# Patient Record
Sex: Female | Born: 1990 | Race: White | Hispanic: No | Marital: Married | Smoking: Never smoker
Health system: Southern US, Community
[De-identification: ages and names within clinical notes are randomized; demographics above are authoritative.]

## PROBLEM LIST (undated history)

## (undated) DIAGNOSIS — G43909 Migraine, unspecified, not intractable, without status migrainosus: Secondary | ICD-10-CM

## (undated) DIAGNOSIS — K5792 Diverticulitis of intestine, part unspecified, without perforation or abscess without bleeding: Secondary | ICD-10-CM

## (undated) DIAGNOSIS — A64 Unspecified sexually transmitted disease: Secondary | ICD-10-CM

## (undated) DIAGNOSIS — M419 Scoliosis, unspecified: Secondary | ICD-10-CM

## (undated) HISTORY — DX: Migraine, unspecified, not intractable, without status migrainosus: G43.909

## (undated) HISTORY — DX: Diverticulitis of intestine, part unspecified, without perforation or abscess without bleeding: K57.92

## (undated) HISTORY — DX: Unspecified sexually transmitted disease: A64

---
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
## 2012-05-21 ENCOUNTER — Encounter: Payer: Self-pay | Admitting: Obstetrics & Gynecology

## 2013-04-09 ENCOUNTER — Other Ambulatory Visit: Payer: Self-pay | Admitting: Obstetrics & Gynecology

## 2013-05-19 ENCOUNTER — Ambulatory Visit: Payer: Self-pay | Admitting: Obstetrics & Gynecology

## 2013-05-19 ENCOUNTER — Other Ambulatory Visit: Payer: Self-pay | Admitting: *Deleted

## 2013-05-19 DIAGNOSIS — IMO0001 Reserved for inherently not codable concepts without codable children: Secondary | ICD-10-CM

## 2013-05-19 MED ORDER — NORGESTIMATE-ETH ESTRADIOL 0.25-35 MG-MCG PO TABS: 1.0000 | ORAL_TABLET | Freq: Every day | ORAL | Status: DC

## 2013-06-27 ENCOUNTER — Ambulatory Visit: Payer: Self-pay | Admitting: Obstetrics & Gynecology

## 2013-08-01 ENCOUNTER — Ambulatory Visit (INDEPENDENT_AMBULATORY_CARE_PROVIDER_SITE_OTHER): Payer: BC Managed Care – PPO | Admitting: Obstetrics & Gynecology

## 2013-08-01 ENCOUNTER — Encounter: Payer: Self-pay | Admitting: Obstetrics & Gynecology

## 2013-08-01 VITALS — BP 108/71 | HR 69 | Wt 136.0 lb

## 2013-08-01 DIAGNOSIS — Z01419 Encounter for gynecological examination (general) (routine) without abnormal findings: Secondary | ICD-10-CM

## 2013-08-01 NOTE — Progress Notes (Signed)
Subjective:     Natasha Hensley is a 23 y.o. female here for a routine exam.  Current complaints: none.    Personal health questionnaire:  Is patient Natasha Hensley, have a family history of breast and/or ovarian cancer: no Is there a family history of uterine cancer diagnosed at age < 9350, gastrointestinal cancer, urinary tract cancer, family member who is a Personnel officerLynch syndrome-associated carrier: no Is the patient overweight and hypertensive, family history of diabetes, personal history of gestational diabetes or PCOS: no Is patient over 5055, have PCOS,  family history of premature CHD under age 25, diabetes, smoke, have hypertension or peripheral artery disease:  no  Gynecologic History Patient's last menstrual period was 07/18/2013. Contraception: OCP (estrogen/progesterone) Last Pap results were: normal  Obstetric History OB History  No data available    History reviewed. No pertinent past medical history.  History reviewed. No pertinent past surgical history.  Current outpatient prescriptions:norgestimate-ethinyl estradiol (SPRINTEC 28) 0.25-35 MG-MCG tablet, Take 1 tablet by mouth daily., Disp: 28 tablet, Rfl: 3 Not on File  History  Substance Use Topics  . Smoking status: Never Smoker   . Smokeless tobacco: Not on file  . Alcohol Use: No    History reviewed. No pertinent family history.    Review of Systems  Constitutional: negative for fatigue and weight loss Respiratory: negative for cough and wheezing Cardiovascular: negative for chest pain, fatigue and palpitations Gastrointestinal: negative for abdominal pain and change in bowel habits Musculoskeletal:negative for myalgias Neurological: negative for gait problems and tremors Behavioral/Psych: negative for abusive relationship, depression Endocrine: negative for temperature intolerance   Genitourinary:negative for abnormal menstrual periods, genital lesions, hot flashes, sexual problems and vaginal  discharge Integument/breast: negative for breast lump, breast tenderness, nipple discharge and skin lesion(s)    Objective:       BP 108/71  Pulse 69  Wt 61.689 kg (136 lb)  LMP 07/18/2013 General:   alert  Skin:   no rash or abnormalities  Lungs:   clear to auscultation bilaterally  Heart:   regular rate and rhythm, S1, S2 normal, no murmur, click, rub or gallop  Breasts:   normal without suspicious masses, skin or nipple changes or axillary nodes  Abdomen:  normal findings: no organomegaly, soft, non-tender and no hernia  Pelvis:  External genitalia: normal general appearance Urinary system: urethral meatus normal and bladder without fullness, nontender Vaginal: normal without tenderness, induration or masses Cervix: normal appearance Adnexa: normal bimanual exam Uterus: anteverted and non-tender, normal size   Lab Review  Labs reviewed no Radiologic studies reviewed no    Assessment:    Healthy female exam.    Plan:    Education reviewed: depression evaluation, low fat, low cholesterol diet, safe sex/STD prevention, weight bearing exercise and HPV catch-up vaccine. Contraception: OCP (estrogen/progesterone). Orders Placed This Encounter  Procedures  . Ambulatory referral to Dermatology    Referral Priority:  Elective    Referral Type:  Consultation    Referral Reason:  Specialty Services Required    Requested Specialty:  Dermatology    Number of Visits Requested:  1   Follow up as needed, or nine valent HPV vaccine

## 2013-08-02 LAB — PAP IG AND CT-NG NAA
Chlamydia Probe Amp: NEGATIVE
GC PROBE AMP: NEGATIVE

## 2013-08-02 NOTE — Patient Instructions (Signed)
Human Papillomavirus (HPV) Gardasil Vaccine What You Need to Know WHAT IS HPV?  Genital human papillomavirus (HPV) is the most common sexually transmitted virus in the [place]. More than half of sexually active men and women are infected with HPV at some time in their lives.  About 20 million Americans are currently infected, and about 6 million more get infected each year. HPV is usually spread through sexual contact.  Most HPV infections do not cause any symptoms and go away on their own. But HPV can cause cervical cancer in women. Cervical cancer is the 2nd leading cause of cancer deaths among women around the world. In the [place], about 12,000 women get cervical cancer every year and about 4,000 are expected to die from it.  HPV is also associated with several less common cancers, such as vaginal and vulvar cancers in women, and anal and oropharyngeal (back of the throat, including base of tongue and tonsils) cancers in both men and women. HPV can also cause genital warts and warts in the throat.  There is no cure for HPV infection, but some of the problems it causes can be treated. HPV VACCINE: WHY GET VACCINATED?  The HPV vaccine you are getting is 1 of 2 vaccines that can be given to prevent HPV. It may be given to both males and females.  This vaccine can prevent most cases of cervical cancer in females, if it is given before exposure to the virus. In addition, it can prevent vaginal and vulvar cancer in females, and genital warts and anal cancer in both males and females.  Protection from HPV vaccine is expected to be long-lasting. But vaccination is not a substitute for cervical cancer screening. Women should still get regular Pap tests. WHO SHOULD GET THIS HPV VACCINE AND WHEN? HPV vaccine is given as a 3-dose series.  1st Dose: Now.  2nd Dose: 1 to 2 months after Dose 1.  3rd Dose: 6 months after Dose 1. Additional (booster) doses are not recommended. Routine  Vaccination This HPV vaccine is recommended for girls and boys 7 or 25 years of age. It may be given starting at age 72. Why is HPV vaccine recommended at 59 or 25 years of age?  HPV infection is easily acquired, even with only one sex partner. That is why it is important to get HPV vaccine before any sexual contact takes place. Also, response to the vaccine is better at this age than at older ages. Catch-Up Vaccination This vaccine is recommended for the following people who have not completed the 3-dose series:   Females 60 through 25 years of age.  Males 29 through 25 years of age. This vaccine may be given to men 63 through 25 years of age who have not completed the 3-dose series. It is recommended for men through age 81 who have sex with men or whose immune system is weakened because of HIV infection, other illness, or medications.  HPV vaccine may be given at the same time as other vaccines. SOME PEOPLE SHOULD NOT GET HPV VACCINE OR SHOULD WAIT  Anyone who has ever had a life-threatening allergic reaction to any other component of HPV vaccine, or to a previous dose of HPV vaccine, should not get the vaccine. Tell your doctor if the person getting vaccinated has any severe allergies, including an allergy to yeast.  HPV vaccine is not recommended for pregnant women. However, receiving HPV vaccine when pregnant is not a reason to consider terminating the pregnancy.  Women who are breastfeeding may get the vaccine.  People who are mildly ill when a dose of HPV is planned can still be vaccinated. People with a moderate or severe illness should wait until they are better. WHAT ARE THE RISKS FROM THIS VACCINE?  This HPV vaccine has been used in the [place] and around the world for about 6 years and has been very safe.  However, any medicine could possibly cause a serious problem, such as a severe allergic reaction. The risk of any vaccine causing a serious injury, or death, is extremely  small.  Life-threatening allergic reactions from vaccines are very rare. If they do occur, it would be within a few minutes to a few hours after the vaccination. Several mild to moderate problems are known to occur with HPV vaccine. These do not last long and go away on their own.  Reactions in the arm where the shot was given:  Pain (about 8 people in 10).  Redness or swelling (about 1 person in 4).  Fever:  Mild (100 F or 37.8 C) (about 1 person in 10).  Moderate (102 F or 38.9 C) (about 1 person in 70).  Other problems:  Headache (about 1 person in 3).  Fainting: Brief fainting spells and related symptoms (such as jerking movements) can happen after any medical procedure, including vaccination. Sitting or lying down for about 15 minutes after a vaccination can help prevent fainting and injuries caused by falls. Tell your doctor if the patient feels dizzy or lightheaded, or has vision changes or ringing in the ears.  Like all vaccines, HPV vaccines will continue to be monitored for unusual or severe problems. WHAT IF THERE IS A SERIOUS REACTION? What should I look for?  Any unusual condition, such as a high fever or unusual behavior. Signs of a serious allergic reaction can include difficulty breathing, hoarseness or wheezing, hives, paleness, weakness, a fast heartbeat, or dizziness. What should I do?  Call a doctor, or get the person to a doctor right away.  Tell your doctor what happened, the date and time it happened, and when the vaccination was given.  Ask your doctor, nurse, or health department to report the reaction by filing a Vaccine Adverse Event Reporting System (VAERS) form. Or, you can file this report through the VAERS website at www.vaers.SamedayNews.es or by calling 587-354-9670. VAERS does not provide medical advice. THE NATIONAL VACCINE INJURY COMPENSATION PROGRAM  The National Vaccine Injury Compensation Program (VICP) is a federal program that was created  to compensate people who may have been injured by certain vaccines.  Persons who believe they may have been injured by a vaccine can learn about the program and about filing a claim by calling 8475407146 or visiting the [place] website at GoldCloset.com.ee [place]?  Ask your doctor.  Call your local or state health department.  Contact the Centers for Disease Control and Prevention (CDC):  Call 410-236-0509 (1-800-CDC-INFO)  or  Visit CDC's website at http://hunter.com/ CDC Human Papillomavirus (HPV) Gardasil (Interim) 06/06/11 Document Released: 11/03/2005 Document Revised: 10/27/2012 Document Reviewed: 04/27/2012 Exit[employer] Patient Information 2015 [place], [place]. This information is not intended to replace advice given to you by your health care provider. Make sure you discuss any questions you have with your health care provider. Health Maintenance, Female A healthy lifestyle and preventative care can promote health and wellness.  Maintain regular health, dental, and eye exams.  Eat a healthy diet. Foods like vegetables, fruits, whole grains, low-fat dairy  products, and lean protein foods contain the nutrients you need without too many calories. Decrease your intake of foods high in solid fats, added sugars, and salt. Get information about a proper diet from your caregiver, if necessary.  Regular physical exercise is one of the most important things you can do for your health. Most adults should get at least 150 minutes of moderate-intensity exercise (any activity that increases your heart rate and causes you to sweat) each week. In addition, most adults need muscle-strengthening exercises on 2 or more days a week.   Maintain a healthy weight. The body mass index (BMI) is a screening tool to identify possible weight problems. It provides an estimate of body fat based on height and weight. Your caregiver can help determine your BMI, and can help you  achieve or maintain a healthy weight. For adults 20 years and older:  A BMI below 18.5 is considered underweight.  A BMI of 18.5 to 24.9 is normal.  A BMI of 25 to 29.9 is considered overweight.  A BMI of 30 and above is considered obese.  Maintain normal blood lipids and cholesterol by exercising and minimizing your intake of saturated fat. Eat a balanced diet with plenty of fruits and vegetables. Blood tests for lipids and cholesterol should begin at age 25 and be repeated every 5 years. If your lipid or cholesterol levels are high, you are over 50, or you are a high risk for heart disease, you may need your cholesterol levels checked more frequently.Ongoing high lipid and cholesterol levels should be treated with medicines if diet and exercise are not effective.  If you smoke, find out from your caregiver how to quit. If you do not use tobacco, do not start.  Lung cancer screening is recommended for adults aged 79-80 years who are at high risk for developing lung cancer because of a history of smoking. Yearly low-dose computed tomography (CT) is recommended for people who have at least a 30-pack-year history of smoking and are a current smoker or have quit within the past 15 years. A pack year of smoking is smoking an average of 1 pack of cigarettes a day for 1 year (for example: 1 pack a day for 30 years or 2 packs a day for 15 years). Yearly screening should continue until the smoker has stopped smoking for at least 15 years. Yearly screening should also be stopped for people who develop a health problem that would prevent them from having lung cancer treatment.  If you are pregnant, do not drink alcohol. If you are breastfeeding, be very cautious about drinking alcohol. If you are not pregnant and choose to drink alcohol, do not exceed 1 drink per day. One drink is considered to be 12 ounces (355 mL) of beer, 5 ounces (148 mL) of wine, or 1.5 ounces (44 mL) of liquor.  Avoid use of street  drugs. Do not share needles with anyone. Ask for help if you need support or instructions about stopping the use of drugs.  High blood pressure causes heart disease and increases the risk of stroke. Blood pressure should be checked at least every 1 to 2 years. Ongoing high blood pressure should be treated with medicines, if weight loss and exercise are not effective.  If you are 88 to 25 years old, ask your caregiver if you should take aspirin to prevent strokes.  Diabetes screening involves taking a blood sample to check your fasting blood sugar level. This should be done once every  3 years, after age 4, if you are within normal weight and without risk factors for diabetes. Testing should be considered at a younger age or be carried out more frequently if you are overweight and have at least 1 risk factor for diabetes.  Breast cancer screening is essential preventative care for women. You should practice "breast self-awareness." This means understanding the normal appearance and feel of your breasts and may include breast self-examination. Any changes detected, no matter how small, should be reported to a caregiver. Women in their 75s and 30s should have a clinical breast exam (CBE) by a caregiver as part of a regular health exam every 1 to 3 years. After age 34, women should have a CBE every year. Starting at age 77, women should consider having a mammogram (breast X-ray) every year. Women who have a family history of breast cancer should talk to their caregiver about genetic screening. Women at a high risk of breast cancer should talk to their caregiver about having an MRI and a mammogram every year.  Breast cancer gene (BRCA)-related cancer risk assessment is recommended for women who have family members with BRCA-related cancers. BRCA-related cancers include breast, ovarian, tubal, and peritoneal cancers. Having family members with these cancers may be associated with an increased risk for harmful  changes (mutations) in the breast cancer genes BRCA1 and BRCA2. Results of the assessment will determine the need for genetic counseling and BRCA1 and BRCA2 testing.  The Pap test is a screening test for cervical cancer. Women should have a Pap test starting at age 50. Between ages 16 and 61, Pap tests should be repeated every 2 years. Beginning at age 6, you should have a Pap test every 3 years as long as the past 3 Pap tests have been normal. If you had a hysterectomy for a problem that was not cancer or a condition that could lead to cancer, then you no longer need Pap tests. If you are between ages 77 and 63, and you have had normal Pap tests going back 10 years, you no longer need Pap tests. If you have had past treatment for cervical cancer or a condition that could lead to cancer, you need Pap tests and screening for cancer for at least 20 years after your treatment. If Pap tests have been discontinued, risk factors (such as a new sexual partner) need to be reassessed to determine if screening should be resumed. Some women have medical problems that increase the chance of getting cervical cancer. In these cases, your caregiver may recommend more frequent screening and Pap tests.  The human papillomavirus (HPV) test is an additional test that may be used for cervical cancer screening. The HPV test looks for the virus that can cause the cell changes on the cervix. The cells collected during the Pap test can be tested for HPV. The HPV test could be used to screen women aged 57 years and older, and should be used in women of any age who have unclear Pap test results. After the age of 8, women should have HPV testing at the same frequency as a Pap test.  Colorectal cancer can be detected and often prevented. Most routine colorectal cancer screening begins at the age of 7 and continues through age 36. However, your caregiver may recommend screening at an earlier age if you have risk factors for colon  cancer. On a yearly basis, your caregiver may provide home test kits to check for hidden blood in the stool. Use  of a small camera at the end of a tube, to directly examine the colon (sigmoidoscopy or colonoscopy), can detect the earliest forms of colorectal cancer. Talk to your caregiver about this at age 36, when routine screening begins. Direct examination of the colon should be repeated every 5 to 10 years through age 24, unless early forms of pre-cancerous polyps or small growths are found.  Hepatitis C blood testing is recommended for all people born from [birth year] through 1967 and any individual with known risks for hepatitis C.  Practice safe sex. Use condoms and avoid high-risk sexual practices to reduce the spread of sexually transmitted infections (STIs). Sexually active women aged 47 and younger should be checked for Chlamydia, which is a common sexually transmitted infection. Older women with new or multiple partners should also be tested for Chlamydia. Testing for other STIs is recommended if you are sexually active and at increased risk.  Osteoporosis is a disease in which the bones lose minerals and strength with aging. This can result in serious bone fractures. The risk of osteoporosis can be identified using a bone density scan. Women ages 15 and over and women at risk for fractures or osteoporosis should discuss screening with their caregivers. Ask your caregiver whether you should be taking a calcium supplement or vitamin D to reduce the rate of osteoporosis.  Menopause can be associated with physical symptoms and risks. Hormone replacement therapy is available to decrease symptoms and risks. You should talk to your caregiver about whether hormone replacement therapy is right for you.  Use sunscreen. Apply sunscreen liberally and repeatedly throughout the day. You should seek shade when your shadow is shorter than you. Protect yourself by wearing long sleeves, pants, a wide-brimmed hat, and  sunglasses year round, whenever you are outdoors.  Notify your caregiver of new moles or changes in moles, especially if there is a change in shape or color. Also notify your caregiver if a mole is larger than the size of a pencil eraser.  Stay current with your immunizations. Document Released: 07/22/2010 Document Revised: 05/03/2012 Document Reviewed: 12/08/2012 [facility] Patient Information 2015 [place], [place]. This information is not intended to replace advice given to you by your health care provider. Make sure you discuss any questions you have with your health care provider.

## 2013-09-03 ENCOUNTER — Other Ambulatory Visit: Payer: Self-pay | Admitting: Obstetrics & Gynecology

## 2014-01-16 ENCOUNTER — Encounter: Payer: Self-pay | Admitting: *Deleted

## 2014-01-17 ENCOUNTER — Encounter: Payer: Self-pay | Admitting: Obstetrics & Gynecology

## 2014-07-18 ENCOUNTER — Telehealth: Payer: Self-pay | Admitting: Obstetrics

## 2014-07-19 ENCOUNTER — Other Ambulatory Visit: Payer: Self-pay | Admitting: *Deleted

## 2014-07-19 DIAGNOSIS — Z3041 Encounter for surveillance of contraceptive pills: Secondary | ICD-10-CM

## 2014-07-19 MED ORDER — NORGESTIMATE-ETH ESTRADIOL 0.25-35 MG-MCG PO TABS
1.0000 | ORAL_TABLET | Freq: Every day | ORAL | Status: DC
Start: 2014-07-19 — End: 2014-11-02

## 2014-07-19 NOTE — Telephone Encounter (Signed)
[id number] - Patient called - AEX appt scheduled for [id number] @1 :45p with Orvilla [place]le Denney. [place] new pharmacy info .Marland Kitchen.[facility] [address]

## 2014-08-08 ENCOUNTER — Encounter: Payer: Self-pay | Admitting: Certified Nurse Midwife

## 2014-08-08 ENCOUNTER — Ambulatory Visit (INDEPENDENT_AMBULATORY_CARE_PROVIDER_SITE_OTHER): Payer: 59 | Admitting: Certified Nurse Midwife

## 2014-08-08 VITALS — BP 115/73 | HR 74 | Temp 97.8°F | Ht 63.0 in | Wt 138.0 lb

## 2014-08-08 DIAGNOSIS — Z3009 Encounter for other general counseling and advice on contraception: Secondary | ICD-10-CM

## 2014-08-08 DIAGNOSIS — Z124 Encounter for screening for malignant neoplasm of cervix: Secondary | ICD-10-CM

## 2014-08-08 DIAGNOSIS — N939 Abnormal uterine and vaginal bleeding, unspecified: Secondary | ICD-10-CM | POA: Diagnosis not present

## 2014-08-08 DIAGNOSIS — Z01419 Encounter for gynecological examination (general) (routine) without abnormal findings: Secondary | ICD-10-CM

## 2014-08-08 LAB — CBC WITH DIFFERENTIAL/PLATELET
BASOS PCT: 1 % (ref 0–1)
Basophils Absolute: 0.1 10*3/uL (ref 0.0–0.1)
EOS ABS: 0.2 10*3/uL (ref 0.0–0.7)
Eosinophils Relative: 3 % (ref 0–5)
HEMATOCRIT: 41.6 % (ref 36.0–46.0)
Hemoglobin: 14.2 g/dL (ref 12.0–15.0)
Lymphocytes Relative: 26 % (ref 12–46)
Lymphs Abs: 2.1 10*3/uL (ref 0.7–4.0)
MCH: 30.6 pg (ref 26.0–34.0)
MCHC: 34.1 g/dL (ref 30.0–36.0)
MCV: 89.7 fL (ref 78.0–100.0)
MPV: 10 fL (ref 8.6–12.4)
Monocytes Absolute: 0.3 10*3/uL (ref 0.1–1.0)
Monocytes Relative: 4 % (ref 3–12)
NEUTROS PCT: 66 % (ref 43–77)
Neutro Abs: 5.3 10*3/uL (ref 1.7–7.7)
PLATELETS: 296 10*3/uL (ref 150–400)
RBC: 4.64 MIL/uL (ref 3.87–5.11)
RDW: 13.9 % (ref 11.5–15.5)
WBC: 8 10*3/uL (ref 4.0–10.5)

## 2014-08-08 LAB — COMPREHENSIVE METABOLIC PANEL
ALT: 17 U/L (ref 0–35)
AST: 21 U/L (ref 0–37)
Albumin: 3.7 g/dL (ref 3.5–5.2)
Alkaline Phosphatase: 46 U/L (ref 39–117)
BUN: 12 mg/dL (ref 6–23)
CHLORIDE: 103 meq/L (ref 96–112)
CO2: 27 mEq/L (ref 19–32)
CREATININE: 0.73 mg/dL (ref 0.50–1.10)
Calcium: 9.5 mg/dL (ref 8.4–10.5)
GLUCOSE: 91 mg/dL (ref 70–99)
Potassium: 4.4 mEq/L (ref 3.5–5.3)
SODIUM: 140 meq/L (ref 135–145)
TOTAL PROTEIN: 7.3 g/dL (ref 6.0–8.3)
Total Bilirubin: 0.5 mg/dL (ref 0.2–1.2)

## 2014-08-08 LAB — HDL CHOLESTEROL: HDL: 45 mg/dL — ABNORMAL LOW (ref >=46)

## 2014-08-08 LAB — HIV ANTIBODY (ROUTINE TESTING W REFLEX): HIV 1&2 Ab, 4th Generation: NONREACTIVE

## 2014-08-08 LAB — TRIGLYCERIDES: Triglycerides: 103 mg/dL (ref <150)

## 2014-08-08 LAB — TSH: TSH: 0.652 u[IU]/mL (ref 0.350–4.500)

## 2014-08-08 LAB — CHOLESTEROL, TOTAL: CHOLESTEROL: 119 mg/dL (ref 0–200)

## 2014-08-08 MED ORDER — NORETHIN-ETH ESTRAD-FE BIPHAS 1 MG-10 MCG / 10 MCG PO TABS: 1.0000 | ORAL_TABLET | Freq: Every day | ORAL | Status: DC

## 2014-08-08 NOTE — Progress Notes (Signed)
Patient ID: Natasha Hensley, female   DOB: [birth year], 24 y.o.   MRN: [id number]    Subjective:      Natasha Hensley is a 24 y.o. female here for a routine exam.  Current complaints: none.  Does desire to discuss birth control.  Has been on Trisprintec, but is having 5-7 day periods with heavy bleeding.  Works full time as a Customer service managerreal estate agent.    Does go out in the sun, educated to use 50spf sunscreen.  Has had a dermatology skin exam within the last 6 months.    Personal health questionnaire:  Is patient [name], have a family history of breast and/or ovarian cancer: no Is there a family history of uterine cancer diagnosed at age < 4950, gastrointestinal cancer, urinary tract cancer, family member who is a Personnel officerLynch syndrome-associated carrier: no Is the patient overweight and hypertensive, family history of diabetes, personal history of gestational diabetes, preeclampsia or PCOS: no Is patient over 1355, have PCOS,  family history of premature CHD under age 26, diabetes, smoke, have hypertension or peripheral artery disease:  no At any time, has a partner hit, kicked or otherwise hurt or frightened you?: no Over the past 2 weeks, have you felt down, depressed or hopeless?: no Over the past 2 weeks, have you felt little interest or pleasure in doing things?:no   Gynecologic History Patient's last menstrual period was 07/10/2014. Contraception: OCP (estrogen/progesterone) Last Pap: 08/01/13. Results were: normal Last mammogram: N/A.   Obstetric History OB History  Gravida Para Term Preterm AB SAB TAB Ectopic Multiple Living  0 0 0 0 0 0 0 0 0 0         History reviewed. No pertinent past medical history.  History reviewed. No pertinent past surgical history.   Current outpatient prescriptions:  .  norgestimate-ethinyl estradiol (SPRINTEC 28) 0.25-35 MG-MCG tablet, Take 1 tablet by mouth daily., Disp: 28 tablet, Rfl: 3 .  Norethindrone-Ethinyl Estradiol-Fe Biphas (LO LOESTRIN FE) 1  MG-10 MCG / 10 MCG tablet, Take 1 tablet by mouth daily., Disp: 1 Package, Rfl: 11 Not on File  History  Substance Use Topics  . Smoking status: Never Smoker   . Smokeless tobacco: Not on file  . Alcohol Use: No    History reviewed. No pertinent family history.    Review of Systems  Constitutional: negative for fatigue and weight loss Respiratory: negative for cough and wheezing Cardiovascular: negative for chest pain, fatigue and palpitations Gastrointestinal: negative for abdominal pain and change in bowel habits Musculoskeletal:negative for myalgias Neurological: negative for gait problems and tremors Behavioral/Psych: negative for abusive relationship, depression Endocrine: negative for temperature intolerance   Genitourinary:negative for abnormal menstrual periods, genital lesions, hot flashes, sexual problems and vaginal discharge Integument/breast: negative for breast lump, breast tenderness, nipple discharge and skin lesion(s)    Objective:       BP 115/73 mmHg  Pulse 74  Temp(Src) 97.8 F (36.6 C)  Ht 5\' 3"  (1.6 m)  Wt 138 lb (62.596 kg)  BMI 24.45 kg/m2  LMP 07/10/2014 General:   alert  Skin:   no rash or abnormalities  Lungs:   clear to auscultation bilaterally  Heart:   regular rate and rhythm, S1, S2 normal, no murmur, click, rub or gallop  Breasts:   normal without suspicious masses, skin or nipple changes or axillary nodes  Abdomen:  normal findings: no organomegaly, soft, non-tender and no hernia  Pelvis:  External genitalia: normal general appearance Urinary system: urethral meatus normal  and bladder without fullness, nontender Vaginal: normal without tenderness, induration or masses Cervix: normal appearance Adnexa: normal bimanual exam Uterus: anteverted and non-tender, normal size   Lab Review Urine pregnancy test Labs reviewed yes Radiologic studies reviewed no  50% of 30 min visit spent on counseling and coordination of care.   Assessment:     Healthy female exam.   Contraception counseling STD exam AUB   Plan:    Education reviewed: depression evaluation, low fat, low cholesterol diet, safe sex/STD prevention, self breast exams, skin cancer screening and weight bearing exercise. Contraception: OCP (estrogen/progesterone). Follow up in: 1 year.   Meds ordered this encounter  Medications  . Norethindrone-Ethinyl Estradiol-Fe Biphas (LO LOESTRIN FE) 1 MG-10 MCG / 10 MCG tablet    Sig: Take 1 tablet by mouth daily.    Dispense:  1 Package    Refill:  11   Orders Placed This Encounter  Procedures  . SureSwab Bacterial Vaginosis/itis  . HIV antibody (with reflex)  . Hepatitis B surface antigen  . RPR  . Hepatitis C antibody  . CBC with Differential/Platelet  . Comprehensive metabolic panel  . TSH  . Cholesterol, total  . Triglycerides  . HDL cholesterol  . Prolactin  . Testosterone, Free, Total, SHBG  . 17-Hydroxyprogesterone  . Progesterone

## 2014-08-09 LAB — PROLACTIN: PROLACTIN: 9.5 ng/mL

## 2014-08-09 LAB — PAP IG W/ RFLX HPV ASCU

## 2014-08-09 LAB — PROGESTERONE: PROGESTERONE: 0.4 ng/mL

## 2014-08-09 LAB — TESTOSTERONE, FREE, TOTAL, SHBG
Sex Hormone Binding: 238 nmol/L — ABNORMAL HIGH (ref 17–124)
TESTOSTERONE FREE: 0.9 pg/mL (ref 0.6–6.8)
Testosterone-% Free: 0.4 % (ref 0.4–2.4)
Testosterone: 24 ng/dL (ref 10–70)

## 2014-08-09 LAB — HEPATITIS B SURFACE ANTIGEN: Hepatitis B Surface Ag: NEGATIVE

## 2014-08-09 LAB — RPR

## 2014-08-09 LAB — HEPATITIS C ANTIBODY: HCV AB: NEGATIVE

## 2014-08-11 LAB — SURESWAB BACTERIAL VAGINOSIS/ITIS
Atopobium vaginae: NOT DETECTED Log (cells/mL)
C. PARAPSILOSIS, DNA: NOT DETECTED
C. TROPICALIS, DNA: NOT DETECTED
C. albicans, DNA: NOT DETECTED
C. glabrata, DNA: NOT DETECTED
GARDNERELLA VAGINALIS: NOT DETECTED Log (cells/mL)
LACTOBACILLUS SPECIES: 5.5 Log (cells/mL)
MEGASPHAERA SPECIES: NOT DETECTED Log (cells/mL)
T. vaginalis RNA, QL TMA: NOT DETECTED

## 2014-08-11 LAB — 17-HYDROXYPROGESTERONE: 17-OH-PROGESTERONE, LC/MS/MS: 9 ng/dL

## 2014-08-15 ENCOUNTER — Other Ambulatory Visit: Payer: Self-pay | Admitting: Certified Nurse Midwife

## 2014-08-15 DIAGNOSIS — N939 Abnormal uterine and vaginal bleeding, unspecified: Secondary | ICD-10-CM

## 2014-08-22 ENCOUNTER — Other Ambulatory Visit: Payer: Self-pay | Admitting: Certified Nurse Midwife

## 2014-08-31 ENCOUNTER — Other Ambulatory Visit: Payer: 59

## 2014-10-29 ENCOUNTER — Emergency Department (HOSPITAL_COMMUNITY)
Admission: EM | Admit: 2014-10-29 | Discharge: 2014-10-29 | Disposition: A | Discharge status: Home / Self Care | Payer: 59 | Attending: Emergency Medicine | Admitting: Emergency Medicine

## 2014-10-29 ENCOUNTER — Encounter (HOSPITAL_COMMUNITY): Payer: Self-pay | Admitting: Oncology

## 2014-10-29 ENCOUNTER — Emergency Department (HOSPITAL_COMMUNITY): Payer: 59

## 2014-10-29 DIAGNOSIS — K5732 Diverticulitis of large intestine without perforation or abscess without bleeding: Secondary | ICD-10-CM | POA: Insufficient documentation

## 2014-10-29 DIAGNOSIS — Z79818 Long term (current) use of other agents affecting estrogen receptors and estrogen levels: Secondary | ICD-10-CM | POA: Diagnosis not present

## 2014-10-29 DIAGNOSIS — Z3202 Encounter for pregnancy test, result negative: Secondary | ICD-10-CM | POA: Diagnosis not present

## 2014-10-29 DIAGNOSIS — R109 Unspecified abdominal pain: Secondary | ICD-10-CM | POA: Diagnosis present

## 2014-10-29 DIAGNOSIS — Z791 Long term (current) use of non-steroidal anti-inflammatories (NSAID): Secondary | ICD-10-CM | POA: Diagnosis not present

## 2014-10-29 DIAGNOSIS — M419 Scoliosis, unspecified: Secondary | ICD-10-CM | POA: Diagnosis not present

## 2014-10-29 HISTORY — DX: Scoliosis, unspecified: M41.9

## 2014-10-29 LAB — URINALYSIS, ROUTINE W REFLEX MICROSCOPIC
BILIRUBIN URINE: NEGATIVE
GLUCOSE, UA: NEGATIVE mg/dL
KETONES UR: 40 mg/dL — AB
NITRITE: NEGATIVE
PH: 5.5 (ref 5.0–8.0)
Protein, ur: NEGATIVE mg/dL
Specific Gravity, Urine: 1.024 (ref 1.005–1.030)
Urobilinogen, UA: 0.2 mg/dL (ref 0.0–1.0)

## 2014-10-29 LAB — URINE MICROSCOPIC-ADD ON

## 2014-10-29 LAB — CBC WITH DIFFERENTIAL/PLATELET
Basophils Absolute: 0.1 10*3/uL (ref 0.0–0.1)
Basophils Relative: 0 %
Eosinophils Absolute: 0.1 10*3/uL (ref 0.0–0.7)
Eosinophils Relative: 1 %
HCT: 37.4 % (ref 36.0–46.0)
Hemoglobin: 12.8 g/dL (ref 12.0–15.0)
Lymphocytes Relative: 5 %
Lymphs Abs: 0.8 10*3/uL (ref 0.7–4.0)
MCH: 30.6 pg (ref 26.0–34.0)
MCHC: 34.2 g/dL (ref 30.0–36.0)
MCV: 89.5 fL (ref 78.0–100.0)
Monocytes Absolute: 1.1 10*3/uL — ABNORMAL HIGH (ref 0.1–1.0)
Monocytes Relative: 7 %
Neutro Abs: 14.4 10*3/uL — ABNORMAL HIGH (ref 1.7–7.7)
Neutrophils Relative %: 87 %
Platelets: 208 10*3/uL (ref 150–400)
RBC: 4.18 MIL/uL (ref 3.87–5.11)
RDW: 13.1 % (ref 11.5–15.5)
WBC: 16.4 10*3/uL — ABNORMAL HIGH (ref 4.0–10.5)

## 2014-10-29 LAB — COMPREHENSIVE METABOLIC PANEL
ALT: 12 U/L — ABNORMAL LOW (ref 14–54)
AST: 14 U/L — ABNORMAL LOW (ref 15–41)
Albumin: 3.3 g/dL — ABNORMAL LOW (ref 3.5–5.0)
Alkaline Phosphatase: 48 U/L (ref 38–126)
Anion gap: 7 (ref 5–15)
BUN: 11 mg/dL (ref 6–20)
CO2: 24 mmol/L (ref 22–32)
Calcium: 8.7 mg/dL — ABNORMAL LOW (ref 8.9–10.3)
Chloride: 108 mmol/L (ref 101–111)
Creatinine, Ser: 0.6 mg/dL (ref 0.44–1.00)
GFR calc Af Amer: >60 mL/min (ref >60)
GFR calc non Af Amer: >60 mL/min (ref >60)
Glucose, Bld: 109 mg/dL — ABNORMAL HIGH (ref 65–99)
Potassium: 3.8 mmol/L (ref 3.5–5.1)
Sodium: 139 mmol/L (ref 135–145)
Total Bilirubin: 0.9 mg/dL (ref 0.3–1.2)
Total Protein: 6.9 g/dL (ref 6.5–8.1)

## 2014-10-29 LAB — PREGNANCY, URINE: Preg Test, Ur: NEGATIVE

## 2014-10-29 MED ORDER — METRONIDAZOLE IN NACL 5-0.79 MG/ML-% IV SOLN
500.0000 mg | Freq: Once | INTRAVENOUS | Status: AC
  Administered 2014-10-29: 500 mg via INTRAVENOUS
  Filled 2014-10-29: qty 100

## 2014-10-29 MED ORDER — OXYCODONE-ACETAMINOPHEN 5-325 MG PO TABS: 1.0000 | ORAL_TABLET | Freq: Four times a day (QID) | ORAL | Status: DC | PRN

## 2014-10-29 MED ORDER — METRONIDAZOLE 500 MG PO TABS: 500.0000 mg | ORAL_TABLET | Freq: Three times a day (TID) | ORAL | Status: DC

## 2014-10-29 MED ORDER — CIPROFLOXACIN HCL 500 MG PO TABS: 500.0000 mg | ORAL_TABLET | Freq: Two times a day (BID) | ORAL | Status: DC

## 2014-10-29 MED ORDER — MORPHINE SULFATE (PF) 4 MG/ML IV SOLN
4.0000 mg | Freq: Once | INTRAVENOUS | Status: AC
  Administered 2014-10-29: 4 mg via INTRAVENOUS
  Filled 2014-10-29: qty 1

## 2014-10-29 MED ORDER — SODIUM CHLORIDE 0.9 % IV BOLUS (SEPSIS)
1000.0000 mL | Freq: Once | INTRAVENOUS | Status: AC
  Administered 2014-10-29: 1000 mL via INTRAVENOUS

## 2014-10-29 MED ORDER — CIPROFLOXACIN IN D5W 400 MG/200ML IV SOLN
400.0000 mg | Freq: Once | INTRAVENOUS | Status: AC
  Administered 2014-10-29: 400 mg via INTRAVENOUS
  Filled 2014-10-29: qty 200

## 2014-10-29 MED ORDER — PROMETHAZINE HCL 25 MG PO TABS: 25.0000 mg | ORAL_TABLET | Freq: Three times a day (TID) | ORAL | Status: DC | PRN

## 2014-10-29 MED ORDER — HYDROMORPHONE HCL 1 MG/ML IJ SOLN
1.0000 mg | Freq: Once | INTRAMUSCULAR | Status: AC
  Administered 2014-10-29: 1 mg via INTRAVENOUS
  Filled 2014-10-29: qty 1

## 2014-10-29 MED ORDER — ONDANSETRON HCL 4 MG/2ML IJ SOLN
4.0000 mg | Freq: Once | INTRAMUSCULAR | Status: AC
  Administered 2014-10-29: 4 mg via INTRAVENOUS
  Filled 2014-10-29: qty 2

## 2014-10-29 NOTE — Discharge Instructions (Signed)
Return here as needed.  Follow-up with the GI Dr. provided.  Follow a more bland diet.    Diverticulitis Diverticulitis is inflammation or infection of small pouches in your colon that form when you have a condition called diverticulosis. The pouches in your colon are called diverticula. Your colon, or large intestine, is where water is absorbed and stool is formed. Complications of diverticulitis can include:  Bleeding.  Severe infection.  Severe pain.  Perforation of your colon.  Obstruction of your colon. CAUSES  Diverticulitis is caused by bacteria. Diverticulitis happens when stool becomes trapped in diverticula. This allows bacteria to grow in the diverticula, which can lead to inflammation and infection. RISK FACTORS People with diverticulosis are at risk for diverticulitis. Eating a diet that does not include enough fiber from fruits and vegetables may make diverticulitis more likely to develop. SYMPTOMS  Symptoms of diverticulitis may include:  Abdominal pain and tenderness. The pain is normally located on the left side of the abdomen, but may occur in other areas.  Fever and chills.  Bloating.  Cramping.  Nausea.  Vomiting.  Constipation.  Diarrhea.  Blood in your stool. DIAGNOSIS  Your health care provider will ask you about your medical history and do a physical exam. You may need to have tests done because many medical conditions can cause the same symptoms as diverticulitis. Tests may include:  Blood tests.  Urine tests.  Imaging tests of the abdomen, including X-rays and CT scans. When your condition is under control, your health care provider may recommend that you have a colonoscopy. A colonoscopy can show how severe your diverticula are and whether something else is causing your symptoms. TREATMENT  Most cases of diverticulitis are mild and can be treated at home. Treatment may include:  Taking over-the-counter pain medicines.  Following a  clear liquid diet.  Taking antibiotic medicines by mouth for 7-10 days. More severe cases may be treated at a hospital. Treatment may include:  Not eating or drinking.  Taking prescription pain medicine.  Receiving antibiotic medicines through an IV tube.  Receiving fluids and nutrition through an IV tube.  Surgery. HOME CARE INSTRUCTIONS   Follow your health care provider's instructions carefully.  Follow a full liquid diet or other diet as directed by your health care provider. After your symptoms improve, your health care provider may tell you to change your diet. He or she may recommend you eat a high-fiber diet. Fruits and vegetables are good sources of fiber. Fiber makes it easier to pass stool.  Take fiber supplements or probiotics as directed by your health care provider.  Only take medicines as directed by your health care provider.  Keep all your follow-up appointments. SEEK MEDICAL CARE IF:   Your pain does not improve.  You have a hard time eating food.  Your bowel movements do not return to normal. SEEK IMMEDIATE MEDICAL CARE IF:   Your pain becomes worse.  Your symptoms do not get better.  Your symptoms suddenly get worse.  You have a fever.  You have repeated vomiting.  You have bloody or black, tarry stools. MAKE SURE YOU:   Understand these instructions.  Will watch your condition.  Will get help right away if you are not doing well or get worse.   This information is not intended to replace advice given to you by your health care provider. Make sure you discuss any questions you have with your health care provider.   Document Released: 10/16/2004 Document Revised:  01/11/2013 Document Reviewed: 12/01/2012 Elsevier Interactive Patient Education [employer].

## 2014-10-29 NOTE — ED Notes (Signed)
Per pt she has been experiencing left side flank pain.  Denies urinary sx.  +nausea/emesis.

## 2014-10-29 NOTE — ED Notes (Signed)
Pt here from home reports having a cramping feeling in her left flank. Pt states she has had chills and nausea for 2 days and has had complaints of flank pain since this afternoon. Pt had 1 episode of emesis, but states she is able to tolerate po fluids and food and denies nausea. Pt currently rates her pain at 8/10. Her last menstrual cycle was 20 days ago

## 2014-10-29 NOTE — ED Provider Notes (Signed)
CSN: [id number]     Arrival date & time 10/29/14  2130 History   First MD Initiated Contact with Patient 10/29/14 [phone]     Chief Complaint  Patient presents with  . Flank Pain     (Consider location/radiation/quality/duration/timing/severity/associated sxs/prior Treatment) HPI  Natasha Hensley is a 24 yo female with history of scoliosis presenting with abdominal pain and back pain. The patient first noticed the pain yesterday at 3 p.m. The pain is severe and located all throughout the patient's left side, suprapubic area and left flank. She states the pain is constant and nothing makes it better or worse. She denies pain on her right side. She has associated subjective fever, chills and nausea with emesis x1 this morning. She denies polyuria, dysuria, hematuria, vaginal discharge, diarrhea, constipation. Last bowel movement was this morning. No hematochezia. LMP was 20 days ago. Patient is taking sprintec and using condoms for birth control. Patient does not think she could be pregnant or have an STD. She denies history of kidney stones, abdominal surgery, or ectopic pregnancy.  Past Medical History  Diagnosis Date  . Scoliosis    History reviewed. No pertinent past surgical history. No family history on file. Social History  Substance Use Topics  . Smoking status: Never Smoker   . Smokeless tobacco: Never Used  . Alcohol Use: No   OB History    Gravida Para Term Preterm AB TAB SAB Ectopic Multiple Living       Review of Systems    Allergies  Review of patient's allergies indicates no known allergies.  Home Medications   Prior to Admission medications   Medication Sig Start Date End Date Taking? Authorizing Provider  ibuprofen (ADVIL,MOTRIN) 200 MG tablet Take 800 mg by mouth every 6 (six) hours as needed.   Yes Historical Provider, MD  meloxicam (MOBIC) 15 MG tablet Take 15 mg by mouth daily.   Yes Historical Provider, MD  norgestimate-ethinyl estradiol  (SPRINTEC 28) 0.25-35 MG-MCG tablet Take 1 tablet by mouth daily. 07/19/14  Yes Brock Bad, MD  oxymetazoline (AFRIN) 0.05 % nasal spray Place 1 spray into both nostrils 2 (two) times daily as needed for congestion.   Yes Historical Provider, MD  Norethindrone-Ethinyl Estradiol-Fe Biphas (LO LOESTRIN FE) 1 MG-10 MCG / 10 MCG tablet Take 1 tablet by mouth daily. Patient not taking: Reported on 10/29/2014 08/08/14   Rachelle A Denney, CNM   BP 118/67 mmHg  Pulse 109  Temp(Src) 98.9 F (37.2 C) (Oral)  Resp 20  Ht  (1.6 m)  Wt 135 lb (61.236 kg)  BMI 23.92 kg/m2  SpO2 100%  LMP 10/10/2014 (Approximate) Physical Exam  Constitutional: She is oriented to person, place, and time. She appears well-developed and well-nourished.  HENT:  Head: Normocephalic and atraumatic.  Mouth/Throat: Oropharynx is clear and moist.  Eyes: Pupils are equal, round, and reactive to light.  Neck: Normal range of motion. Neck supple.  Cardiovascular: Normal rate and regular rhythm.   Pulmonary/Chest: Effort normal and breath sounds normal. No respiratory distress. She has no wheezes.  Abdominal: Soft. Bowel sounds are normal. She exhibits no distension and no mass. There is tenderness. There is CVA tenderness. There is no guarding.  Neurological: She is alert and oriented to person, place, and time. She exhibits normal muscle tone. Coordination normal.  Skin: Skin is warm and dry. She is not diaphoretic.  Psychiatric: She has a normal mood and affect. Her  behavior is normal.  Nursing note reviewed.   ED Course  Procedures (including critical care time) Labs Review Labs Reviewed  URINALYSIS, ROUTINE W REFLEX MICROSCOPIC (NOT AT [facility]) - Abnormal; Notable for the following:    APPearance CLOUDY (*)    Hgb urine dipstick SMALL (*)    Ketones, ur 40 (*)    Leukocytes, UA TRACE (*)    All other components within normal limits  URINE MICROSCOPIC-ADD ON - Abnormal; Notable for the following:    Bacteria,  UA FEW (*)    All other components within normal limits  CBC WITH DIFFERENTIAL/PLATELET - Abnormal; Notable for the following:    WBC 16.4 (*)    Neutro Abs 14.4 (*)    Monocytes Absolute 1.1 (*)    All other components within normal limits  PREGNANCY, URINE  COMPREHENSIVE METABOLIC PANEL    Imaging Review No results found. I have personally reviewed and evaluated these images and lab results as part of my medical decision-making.  Patient will be given follow-up with GI.  She is advised to return here for any worsening in her condition.  She has tolerated oral fluids here.  Patient's pain is improved.  She is given IV fluids.  She is told to use a bland diet   Charlestine Night, PA-C 10/29/14 1045  Mancel Bale, MD 10/29/14 [phone]

## 2014-11-02 ENCOUNTER — Other Ambulatory Visit: Payer: Self-pay | Admitting: Obstetrics

## 2015-01-25 ENCOUNTER — Other Ambulatory Visit: Payer: Self-pay | Admitting: Obstetrics

## 2015-03-21 ENCOUNTER — Other Ambulatory Visit: Payer: Self-pay | Admitting: Obstetrics

## 2015-05-17 ENCOUNTER — Other Ambulatory Visit: Payer: Self-pay | Admitting: Obstetrics

## 2015-08-07 ENCOUNTER — Other Ambulatory Visit: Payer: Self-pay | Admitting: Orthopedic Surgery

## 2015-08-07 DIAGNOSIS — Q76414 Congenital kyphosis, thoracic region: Secondary | ICD-10-CM

## 2015-08-07 DIAGNOSIS — M419 Scoliosis, unspecified: Secondary | ICD-10-CM

## 2015-08-10 ENCOUNTER — Ambulatory Visit
Admission: RE | Admit: 2015-08-10 | Discharge: 2015-08-10 | Disposition: A | Discharge status: Home / Self Care | Payer: BLUE CROSS/BLUE SHIELD | Source: Ambulatory Visit | Attending: Orthopedic Surgery | Admitting: Orthopedic Surgery

## 2015-08-10 DIAGNOSIS — Q76414 Congenital kyphosis, thoracic region: Secondary | ICD-10-CM

## 2015-08-10 DIAGNOSIS — M419 Scoliosis, unspecified: Secondary | ICD-10-CM

## 2015-08-16 ENCOUNTER — Telehealth: Payer: Self-pay | Admitting: *Deleted

## 2015-08-17 ENCOUNTER — Ambulatory Visit (INDEPENDENT_AMBULATORY_CARE_PROVIDER_SITE_OTHER): Payer: BLUE CROSS/BLUE SHIELD | Admitting: Certified Nurse Midwife

## 2015-08-17 ENCOUNTER — Encounter: Payer: Self-pay | Admitting: Certified Nurse Midwife

## 2015-08-17 VITALS — BP 116/74 | HR 93 | Temp 98.5°F | Wt 143.0 lb

## 2015-08-17 DIAGNOSIS — Z01419 Encounter for gynecological examination (general) (routine) without abnormal findings: Secondary | ICD-10-CM | POA: Diagnosis not present

## 2015-08-17 DIAGNOSIS — Z3009 Encounter for other general counseling and advice on contraception: Secondary | ICD-10-CM

## 2015-08-17 MED ORDER — NORGESTIMATE-ETH ESTRADIOL 0.25-35 MG-MCG PO TABS: 1.0000 | ORAL_TABLET | Freq: Every day | ORAL | 4 refills | Status: DC

## 2015-08-17 MED ORDER — NORETHIN-ETH ESTRAD-FE BIPHAS 1 MG-10 MCG / 10 MCG PO TABS: 1.0000 | ORAL_TABLET | Freq: Every day | ORAL | 4 refills | Status: DC

## 2015-08-17 NOTE — Addendum Note (Signed)
Addended by: Marya Landry D on: 08/17/2015 03:13 PM   Modules accepted: Orders

## 2015-08-17 NOTE — Progress Notes (Signed)
Subjective:        Natasha Hensley is a 25 y.o. female here for a routine exam.  Current complaints: back pain.  Is scheduled for back surgery for scoliosis.  Was changed to LoLo last year.  Desires to continue on Sprintec instead of LoLo.  Discussed using continuous Sprintec. States that her periods are 5-7 days.   Declines STD screening.  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    Personal health questionnaire:  Is patient [name], have a family history of breast and/or ovarian cancer: no Is there a family history of uterine cancer diagnosed at age < 53, gastrointestinal cancer, urinary tract cancer, family member who is a Personnel officer syndrome-associated carrier: no Is the patient overweight and hypertensive, family history of diabetes, personal history of gestational diabetes, preeclampsia or PCOS: no Is patient over 41, have PCOS,  family history of premature CHD under age 4, diabetes, smoke, have hypertension or peripheral artery disease:  no At any time, has a partner hit, kicked or otherwise hurt or frightened you?: no Over the past 2 weeks, have you felt down, depressed or hopeless?: no Over the past 2 weeks, have you felt little interest or pleasure in doing things?:no   Gynecologic History Patient's last menstrual period was 08/10/2015. Contraception: OCP (estrogen/progesterone) Last Pap: 07/2014. Results were: normal Last mammogram: N/A.   Obstetric History OB History  Gravida Para Term Preterm AB Living  0 0 0 0 0 0  SAB TAB Ectopic Multiple Live Births  0 0 0 0          Past Medical History:  Diagnosis Date  . Scoliosis     History reviewed. No pertinent surgical history.   Current Outpatient Prescriptions:  .  Norethindrone-Ethinyl Estradiol-Fe Biphas (LO LOESTRIN FE) 1 MG-10 MCG / 10 MCG tablet, Take 1 tablet by mouth daily., Disp: 1 Package, Rfl: 11 .  ciprofloxacin (CIPRO) 500 MG tablet, Take 1 tablet (500 mg total) by mouth 2 (two)  times daily. (Patient not taking: Reported on 08/17/2015), Disp: 20 tablet, Rfl: 0 .  ibuprofen (ADVIL,MOTRIN) 200 MG tablet, Take 800 mg by mouth every 6 (six) hours as needed., Disp: , Rfl:  .  meloxicam (MOBIC) 15 MG tablet, Take 15 mg by mouth daily., Disp: , Rfl:  .  oxymetazoline (AFRIN) 0.05 % nasal spray, Place 1 spray into both nostrils 2 (two) times daily as needed for congestion., Disp: , Rfl:  .  promethazine (PHENERGAN) 25 MG tablet, Take 1 tablet (25 mg total) by mouth every 8 (eight) hours as needed for nausea or vomiting. (Patient not taking: Reported on 08/17/2015), Disp: 15 tablet, Rfl: 0 .  SPRINTEC 28 0.25-35 MG-MCG tablet, TAKE 1 TABLET BY MOUTH DAILY. (Patient not taking: Reported on 08/17/2015), Disp: 28 tablet, Rfl: 1 No Known Allergies  Social History  Substance Use Topics  . Smoking status: Never Smoker  . Smokeless tobacco: Never Used  . Alcohol use No    History reviewed. No pertinent family history.    Review of Systems  Constitutional: negative for fatigue and weight loss Respiratory: negative for cough and wheezing Cardiovascular: negative for chest pain, fatigue and palpitations Gastrointestinal: negative for abdominal pain and change in bowel habits Musculoskeletal:negative for myalgias Neurological: negative for gait problems and tremors Behavioral/Psych: negative for abusive relationship, depression Endocrine: negative for temperature intolerance   Genitourinary:negative for abnormal menstrual periods, genital lesions, hot flashes, sexual problems and vaginal discharge Integument/breast: negative for  breast lump, breast tenderness, nipple discharge and skin lesion(s)    Objective:       BP 116/74   Pulse 93   Temp 98.5 F (36.9 C)   Wt 143 lb (64.9 kg)   LMP 08/10/2015   BMI 25.33 kg/m  General:   alert  Skin:   no rash or abnormalities  Lungs:   clear to auscultation bilaterally  Heart:   regular rate and rhythm, S1, S2 normal, no murmur,  click, rub or gallop  Breasts:   normal without suspicious masses, skin or nipple changes or axillary nodes  Abdomen:  normal findings: no organomegaly, soft, non-tender and no hernia  Pelvis:  External genitalia: normal general appearance Urinary system: urethral meatus normal and bladder without fullness, nontender Vaginal: normal without tenderness, induration or masses Cervix: normal appearance Adnexa: normal bimanual exam Uterus: anteverted and non-tender, normal size   Lab Review Urine pregnancy test Labs reviewed yes Radiologic studies reviewed no  50% of 30 min visit spent on counseling and coordination of care.   Assessment:    Healthy female exam.   Contraception management: desires to continue Sprintec continuous for 3 mo.   Plan:    Education reviewed: calcium supplements, depression evaluation, low fat, low cholesterol diet, safe sex/STD prevention, self breast exams, skin cancer screening and weight bearing exercise. Contraception: OCP (estrogen/progesterone). Follow up in: 1 year.   No orders of the defined types were placed in this encounter.  No orders of the defined types were placed in this encounter.   Possible management options include: trial of LOLO Follow up as needed.

## 2015-08-17 NOTE — Addendum Note (Signed)
Addended by: Marya Landry D on: 08/17/2015 02:39 PM   Modules accepted: Orders

## 2015-08-21 LAB — PAP IG W/ RFLX HPV ASCU: PAP Smear Comment: 0

## 2015-08-22 LAB — NUSWAB BV AND CANDIDA, NAA
Atopobium vaginae: HIGH Score — AB
Candida albicans, NAA: POSITIVE — AB
Candida glabrata, NAA: NEGATIVE

## 2015-08-27 ENCOUNTER — Other Ambulatory Visit: Payer: Self-pay | Admitting: Certified Nurse Midwife

## 2015-08-27 DIAGNOSIS — B373 Candidiasis of vulva and vagina: Secondary | ICD-10-CM

## 2015-08-27 DIAGNOSIS — B3731 Acute candidiasis of vulva and vagina: Secondary | ICD-10-CM

## 2015-08-27 MED ORDER — FLUCONAZOLE 200 MG PO TABS: 200.0000 mg | ORAL_TABLET | Freq: Once | ORAL | 0 refills | Status: AC

## 2015-08-28 ENCOUNTER — Encounter: Payer: Self-pay | Admitting: *Deleted

## 2015-08-29 NOTE — Telephone Encounter (Signed)
Medication called to pharmacy. 

## 2015-09-21 HISTORY — PX: SPINAL FUSION: SHX223

## 2015-11-29 DIAGNOSIS — M41125 Adolescent idiopathic scoliosis, thoracolumbar region: Secondary | ICD-10-CM | POA: Insufficient documentation

## 2016-08-20 HISTORY — PX: BREAST ENHANCEMENT SURGERY: SHX7

## 2016-09-26 ENCOUNTER — Other Ambulatory Visit: Payer: Self-pay | Admitting: Obstetrics

## 2016-09-26 DIAGNOSIS — Z3009 Encounter for other general counseling and advice on contraception: Secondary | ICD-10-CM

## 2016-09-26 NOTE — Telephone Encounter (Signed)
Please review for refill. Pt has not been seen since 07/2015

## 2016-09-26 NOTE — Progress Notes (Signed)
Pt informed. Pt will callback to schedule appt

## 2016-11-12 ENCOUNTER — Encounter: Payer: Self-pay | Admitting: Certified Nurse Midwife

## 2016-11-12 ENCOUNTER — Ambulatory Visit (INDEPENDENT_AMBULATORY_CARE_PROVIDER_SITE_OTHER): Payer: BLUE CROSS/BLUE SHIELD | Admitting: Certified Nurse Midwife

## 2016-11-12 VITALS — BP 112/69 | HR 82 | Ht 65.0 in | Wt 155.9 lb

## 2016-11-12 DIAGNOSIS — Z124 Encounter for screening for malignant neoplasm of cervix: Secondary | ICD-10-CM | POA: Diagnosis not present

## 2016-11-12 DIAGNOSIS — Z1151 Encounter for screening for human papillomavirus (HPV): Secondary | ICD-10-CM

## 2016-11-12 DIAGNOSIS — N898 Other specified noninflammatory disorders of vagina: Secondary | ICD-10-CM

## 2016-11-12 DIAGNOSIS — Z01419 Encounter for gynecological examination (general) (routine) without abnormal findings: Secondary | ICD-10-CM | POA: Diagnosis not present

## 2016-11-12 DIAGNOSIS — Z3009 Encounter for other general counseling and advice on contraception: Secondary | ICD-10-CM

## 2016-11-12 MED ORDER — NORGESTIMATE-ETH ESTRADIOL 0.25-35 MG-MCG PO TABS: 1.0000 | ORAL_TABLET | Freq: Every day | ORAL | 6 refills | Status: DC

## 2016-11-12 NOTE — Progress Notes (Signed)
Patient is in the office for annual, last pap 08-17-15.

## 2016-11-12 NOTE — Progress Notes (Signed)
Subjective:        Natasha Hensley is a 26 y.o. female here for a routine exam.  Discussed using continuous Sprintec. States that her periods are 5-7 days.   Declines STD screening.  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  Had back surgery last year for scoliosis.  Had breast augmentation a few months ago.  Declines STD testing.  Same partner.    Personal health questionnaire:  Is patient [name], have a family history of breast and/or ovarian cancer: no Is there a family history of uterine cancer diagnosed at age < 29, gastrointestinal cancer, urinary tract cancer, family member who is a Personnel officer syndrome-associated carrier: no Is the patient overweight and hypertensive, family history of diabetes, personal history of gestational diabetes, preeclampsia or PCOS: no Is patient over 56, have PCOS, family history of premature CHD under age 11, diabetes, smoke, have hypertension or peripheral artery disease: no At any time, has a partner hit, kicked or otherwise hurt or frightened you?: no Over the past 2 weeks, have you felt down, depressed or hopeless?: no Over the past 2 weeks, have you felt little interest or pleasure in doing things?:no  Gynecologic History Patient's last menstrual period was 10/24/2016. Contraception: OCP (estrogen/progesterone) Last Pap: 08/15/16. Results were: normal Last mammogram: n/a <40 years.  Obstetric History OB History  Gravida Para Term Preterm AB Living  0 0 0 0 0 0  SAB TAB Ectopic Multiple Live Births  0 0 0 0          Past Medical History:  Diagnosis Date  . Scoliosis     Past Surgical History:  Procedure Laterality Date  . BREAST ENHANCEMENT SURGERY  08/2016  . SPINAL FUSION  09/2015     Current Outpatient Prescriptions:  .  norgestimate-ethinyl estradiol (SPRINTEC 28) 0.25-35 MG-MCG tablet, Take 1 tablet by mouth daily., Disp: 56 tablet, Rfl: 6 .  ibuprofen (ADVIL,MOTRIN) 200 MG tablet, Take 800 mg by mouth every 6  (six) hours as needed., Disp: , Rfl:  .  meloxicam (MOBIC) 15 MG tablet, Take 15 mg by mouth daily., Disp: , Rfl:  .  oxymetazoline (AFRIN) 0.05 % nasal spray, Place 1 spray into both nostrils 2 (two) times daily as needed for congestion., Disp: , Rfl:  No Known Allergies  Social History  Substance Use Topics  . Smoking status: Never Smoker  . Smokeless tobacco: Never Used  . Alcohol use No    Family History  Problem Relation Age of Onset  . Lupus Mother       Review of Systems  Constitutional: negative for fatigue and weight loss Respiratory: negative for cough and wheezing Cardiovascular: negative for chest pain, fatigue and palpitations Gastrointestinal: negative for abdominal pain and change in bowel habits Musculoskeletal:negative for myalgias Neurological: negative for gait problems and tremors Behavioral/Psych: negative for abusive relationship, depression Endocrine: negative for temperature intolerance    Genitourinary:negative for abnormal menstrual periods, genital lesions, hot flashes, sexual problems and vaginal discharge Integument/breast: negative for breast lump, breast tenderness, nipple discharge and skin lesion(s)    Objective:       BP 112/69   Pulse 82   Ht 5\' 5"  (1.651 m)   Wt 155 lb 14.4 oz (70.7 kg)   LMP 10/24/2016   BMI 25.94 kg/m  General:   alert  Skin:   no rash or abnormalities  Lungs:   clear to auscultation bilaterally  Heart:   regular rate and rhythm, S1,  S2 normal, no murmur, click, rub or gallop  Breasts:   normal without suspicious masses, skin or nipple changes or axillary nodes  Abdomen:  normal findings: no organomegaly, soft, non-tender and no hernia  Pelvis:  External genitalia: normal general appearance Urinary system: urethral meatus normal and bladder without fullness, nontender Vaginal: normal without tenderness, induration or masses Cervix: normal appearance Adnexa: normal bimanual exam Uterus: anteverted and non-tender,  normal size   Lab Review Urine pregnancy test Labs reviewed yes Radiologic studies reviewed no  50% of 30 min visit spent on counseling and coordination of care.    Assessment & Plan    Healthy female exam.      Education reviewed: calcium supplements, depression evaluation, low fat, low cholesterol diet, safe sex/STD prevention, self breast exams, skin cancer screening and weight bearing exercise. Contraception: OCP (estrogen/progesterone). Follow up in: 1 year.   Meds ordered this encounter  Medications  . norgestimate-ethinyl estradiol (SPRINTEC 28) 0.25-35 MG-MCG tablet    Sig: Take 1 tablet by mouth daily.    Dispense:  56 tablet    Refill:  6     Possible management options include: another OCP Follow up as needed.

## 2016-11-13 LAB — CERVICOVAGINAL ANCILLARY ONLY
Bacterial vaginitis: NEGATIVE
CANDIDA VAGINITIS: NEGATIVE

## 2016-11-17 LAB — CYTOLOGY - PAP
DIAGNOSIS: NEGATIVE
HPV (WINDOPATH): DETECTED — AB
HPV 16/18/45 GENOTYPING: NEGATIVE

## 2017-04-23 ENCOUNTER — Other Ambulatory Visit: Payer: Self-pay | Admitting: Physician Assistant

## 2017-05-12 ENCOUNTER — Other Ambulatory Visit: Payer: Self-pay | Admitting: Physician Assistant

## 2018-03-15 ENCOUNTER — Telehealth: Payer: Self-pay | Admitting: Advanced Practice Midwife

## 2018-03-15 MED ORDER — NORGESTIMATE-ETH ESTRADIOL 0.25-35 MG-MCG PO TABS: 1.0000 | ORAL_TABLET | Freq: Every day | ORAL | 0 refills | Status: DC

## 2018-03-15 NOTE — Telephone Encounter (Signed)
Patient called regarding her BC refill.  Her last pack runs out this week.  Patient has not been seen since 2018. 1 Pack refilled and patient has been scheduled for her annual with Sharen Counter for next week.

## 2018-03-22 ENCOUNTER — Ambulatory Visit: Payer: BLUE CROSS/BLUE SHIELD | Admitting: Advanced Practice Midwife

## 2018-04-07 ENCOUNTER — Encounter (HOSPITAL_COMMUNITY): Payer: Self-pay

## 2018-04-07 ENCOUNTER — Emergency Department (HOSPITAL_COMMUNITY)
Admission: EM | Admit: 2018-04-07 | Discharge: 2018-04-07 | Disposition: A | Discharge status: Home / Self Care | Payer: BLUE CROSS/BLUE SHIELD | Attending: Emergency Medicine | Admitting: Emergency Medicine

## 2018-04-07 ENCOUNTER — Other Ambulatory Visit: Payer: Self-pay

## 2018-04-07 ENCOUNTER — Emergency Department (HOSPITAL_COMMUNITY): Payer: BLUE CROSS/BLUE SHIELD

## 2018-04-07 DIAGNOSIS — K5732 Diverticulitis of large intestine without perforation or abscess without bleeding: Secondary | ICD-10-CM | POA: Diagnosis not present

## 2018-04-07 DIAGNOSIS — Z79899 Other long term (current) drug therapy: Secondary | ICD-10-CM | POA: Insufficient documentation

## 2018-04-07 DIAGNOSIS — R1031 Right lower quadrant pain: Secondary | ICD-10-CM | POA: Diagnosis present

## 2018-04-07 LAB — COMPREHENSIVE METABOLIC PANEL
ALK PHOS: 57 U/L (ref 38–126)
ALT: 13 U/L (ref 0–44)
ANION GAP: 8 (ref 5–15)
AST: 17 U/L (ref 15–41)
Albumin: 3.4 g/dL — ABNORMAL LOW (ref 3.5–5.0)
BILIRUBIN TOTAL: 0.9 mg/dL (ref 0.3–1.2)
BUN: 8 mg/dL (ref 6–20)
CALCIUM: 8.9 mg/dL (ref 8.9–10.3)
CO2: 21 mmol/L — ABNORMAL LOW (ref 22–32)
Chloride: 107 mmol/L (ref 98–111)
Creatinine, Ser: 0.78 mg/dL (ref 0.44–1.00)
GFR calc Af Amer: >60 mL/min (ref >60)
GLUCOSE: 96 mg/dL (ref 70–99)
POTASSIUM: 3.8 mmol/L (ref 3.5–5.1)
Sodium: 136 mmol/L (ref 135–145)
TOTAL PROTEIN: 6.9 g/dL (ref 6.5–8.1)

## 2018-04-07 LAB — CBC WITH DIFFERENTIAL/PLATELET
Abs Immature Granulocytes: 0.04 10*3/uL (ref 0.00–0.07)
Basophils Absolute: 0.1 10*3/uL (ref 0.0–0.1)
Basophils Relative: 0 %
EOS ABS: 0.2 10*3/uL (ref 0.0–0.5)
EOS PCT: 1 %
HEMATOCRIT: 42.6 % (ref 36.0–46.0)
HEMOGLOBIN: 14.2 g/dL (ref 12.0–15.0)
Immature Granulocytes: 0 %
LYMPHS ABS: 1.5 10*3/uL (ref 0.7–4.0)
LYMPHS PCT: 10 %
MCH: 29.8 pg (ref 26.0–34.0)
MCHC: 33.3 g/dL (ref 30.0–36.0)
MCV: 89.5 fL (ref 80.0–100.0)
MONO ABS: 0.9 10*3/uL (ref 0.1–1.0)
MONOS PCT: 6 %
Neutro Abs: 11.8 10*3/uL — ABNORMAL HIGH (ref 1.7–7.7)
Neutrophils Relative %: 83 %
Platelets: 244 10*3/uL (ref 150–400)
RBC: 4.76 MIL/uL (ref 3.87–5.11)
RDW: 12.7 % (ref 11.5–15.5)
WBC: 14.4 10*3/uL — ABNORMAL HIGH (ref 4.0–10.5)
nRBC: 0 % (ref 0.0–0.2)

## 2018-04-07 LAB — URINALYSIS, ROUTINE W REFLEX MICROSCOPIC
Bilirubin Urine: NEGATIVE
Glucose, UA: NEGATIVE mg/dL
Ketones, ur: 80 mg/dL — AB
LEUKOCYTE UA: NEGATIVE
NITRITE: NEGATIVE
PH: 6 (ref 5.0–8.0)
Protein, ur: NEGATIVE mg/dL
Specific Gravity, Urine: >1.046 — ABNORMAL HIGH (ref 1.005–1.030)

## 2018-04-07 LAB — I-STAT BETA HCG BLOOD, ED (MC, WL, AP ONLY)

## 2018-04-07 LAB — LIPASE, BLOOD: LIPASE: 24 U/L (ref 11–51)

## 2018-04-07 MED ORDER — POLYETHYLENE GLYCOL 3350 17 G PO PACK: 17.0000 g | PACK | Freq: Every day | ORAL | 0 refills | Status: DC

## 2018-04-07 MED ORDER — HYDROMORPHONE HCL 1 MG/ML IJ SOLN
0.5000 mg | Freq: Once | INTRAMUSCULAR | Status: AC
  Administered 2018-04-07: 0.5 mg via INTRAVENOUS
  Filled 2018-04-07: qty 1

## 2018-04-07 MED ORDER — IOHEXOL 300 MG/ML  SOLN
75.0000 mL | Freq: Once | INTRAMUSCULAR | Status: AC | PRN
  Administered 2018-04-07: 75 mL via INTRAVENOUS

## 2018-04-07 MED ORDER — CIPROFLOXACIN HCL 500 MG PO TABS: 500.0000 mg | ORAL_TABLET | Freq: Two times a day (BID) | ORAL | 0 refills | Status: AC

## 2018-04-07 MED ORDER — METRONIDAZOLE 500 MG PO TABS: 500.0000 mg | ORAL_TABLET | Freq: Three times a day (TID) | ORAL | 0 refills | Status: AC

## 2018-04-07 MED ORDER — OXYCODONE-ACETAMINOPHEN 5-325 MG PO TABS: 1.0000 | ORAL_TABLET | Freq: Four times a day (QID) | ORAL | 0 refills | Status: AC | PRN

## 2018-04-07 MED ORDER — MORPHINE SULFATE (PF) 2 MG/ML IV SOLN
2.0000 mg | Freq: Once | INTRAVENOUS | Status: AC
  Administered 2018-04-07: 2 mg via INTRAVENOUS
  Filled 2018-04-07: qty 1

## 2018-04-07 MED ORDER — MORPHINE SULFATE (PF) 2 MG/ML IV SOLN: 2.0000 mg | Freq: Once | INTRAVENOUS | Status: DC

## 2018-04-07 NOTE — Discharge Instructions (Addendum)
Dear Natasha Hensley  You came to [place] with abdominal pain. We have determined this was caused by diverticulitis. Here are our recommendations for you at discharge:  Please take ciprofloxacin 500mg  twice daily for 7 days Please take metronidazole 500mg  every 8 hours for 7 days Take Percocet 5-325mg  every 6 hours as needed for pain Please follow up with a GI doctor  Thank you for choosing [facility].

## 2018-04-07 NOTE — ED Notes (Signed)
Urine culture collected and sent down to main lab 

## 2018-04-07 NOTE — ED Provider Notes (Signed)
[facility] EMERGENCY DEPARTMENT Provider Note   CSN: [id number] Arrival date & time: 04/07/18  1191    History   Chief Complaint Chief Complaint  Patient presents with  . Abdominal Pain    HPI Natasha Hensley is a 28 y.o. female w/ PMH of diverticulitis presenting with abdominal pain. She was in her usual state of health until early evening when she began to have acute onset cramping RLQ pain without any obvious inciting event. She states she had prior episode of diverticulitis and this feels similar. She states she initially was trying to avoid visiting the ED but her pain significantly worsened to 10/10 overnight with radiation to RUQ with constant duration and came for evaluation. She denies any sick contact, dietary changes or trauma. She mentions she is currently sexually active in monogamous relationship with oral contraceptive as her only protection. Currently having some nausea but she denies any F/V/D/C. Denies any dysuria, urgency, or frequency.     Past Medical History:  Diagnosis Date  . Scoliosis     There are no active problems to display for this patient.   Past Surgical History:  Procedure Laterality Date  . BREAST ENHANCEMENT SURGERY  08/2016  . SPINAL FUSION  09/2015    OB History    Gravida  0   Para  0   Term  0   Preterm  0   AB  0   Living  0     SAB  0   TAB  0   Ectopic  0   Multiple  0   Live Births             Home Medications    Prior to Admission medications   Medication Sig Start Date End Date Taking? Authorizing Provider  ibuprofen (ADVIL,MOTRIN) 200 MG tablet Take 800 mg by mouth every 6 (six) hours as needed.   Yes [provider]  norgestimate-ethinyl estradiol (ORTHO-CYCLEN,SPRINTEC,PREVIFEM) 0.25-35 MG-MCG tablet Take 1 tablet by mouth daily. 03/15/18  Yes Leftwich-Kirby, Wilmer Floor, CNM  valACYclovir (VALTREX) 500 MG tablet Take 500 mg by mouth as needed. For outbreak 01/23/18  Yes [provider]  ciprofloxacin (CIPRO) 500 MG tablet Take 1 tablet (500 mg total) by mouth every 12 (twelve) hours for 7 days. 04/07/18 04/14/18  Theotis Barrio, MD  metroNIDAZOLE (FLAGYL) 500 MG tablet Take 1 tablet (500 mg total) by mouth 3 (three) times daily for 7 days. 04/07/18 04/14/18  Theotis Barrio, MD  oxyCODONE-acetaminophen (PERCOCET/ROXICET) 5-325 MG tablet Take 1 tablet by mouth every 6 (six) hours as needed for up to 5 days for severe pain. 04/07/18 04/12/18  Theotis Barrio, MD  polyethylene glycol [facility] / Ethelene Hal) packet Take 17 g by mouth daily. 04/07/18   Theotis Barrio, MD   Family History Family History  Problem Relation Age of Onset  . Lupus Mother    Social History Social History   Tobacco Use  . Smoking status: Never Smoker  . Smokeless tobacco: Never Used  Substance Use Topics  . Alcohol use: No    Alcohol/week: 0.0 standard drinks  . Drug use: No   Allergies   Hydrocodone  Review of Systems Review of Systems  Constitutional: Positive for appetite change. Negative for chills and fever.  Respiratory: Negative for cough, shortness of breath and wheezing.   Cardiovascular: Negative for chest pain and palpitations.  Gastrointestinal: Positive for abdominal pain and nausea. Negative for constipation, diarrhea and vomiting.  Genitourinary: Negative for dysuria, flank pain and urgency.  Neurological: Negative for dizziness, weakness and light-headedness.  All other systems reviewed and are negative.   Physical Exam Updated Vital Signs BP 102/78 (BP Location: Right Arm)   Pulse 75   Temp 98.2 F (36.8 C) (Oral)   Resp 16   LMP 03/09/2018 (Approximate)   SpO2 100%   Physical Exam Constitutional:      General: She is in acute distress.     Appearance: She is normal weight.  HENT:     Mouth/Throat:     Mouth: Mucous membranes are moist.     Pharynx: Oropharynx is clear.  Eyes:     Extraocular Movements: Extraocular movements intact.  Cardiovascular:      Rate and Rhythm: Normal rate and regular rhythm.     Heart sounds: Normal heart sounds.  Pulmonary:     Effort: Pulmonary effort is normal.     Breath sounds: Normal breath sounds. No wheezing or rales.  Abdominal:     General: Abdomen is flat. Bowel sounds are normal. There is no distension.     Palpations: Abdomen is soft.     Tenderness: There is abdominal tenderness in the right lower quadrant. There is no right CVA tenderness, left CVA tenderness, guarding or rebound. Negative signs include Murphy's sign and psoas sign.     Hernia: No hernia is present.  Skin:    General: Skin is warm and dry.  Neurological:     Mental Status: She is alert.    ED Treatments / Results  Labs (all labs ordered are listed, but only abnormal results are displayed) Labs Reviewed  CBC WITH DIFFERENTIAL/PLATELET - Abnormal; Notable for the following components:      Result Value   WBC 14.4 (*)    Neutro Abs 11.8 (*)    All other components within normal limits  COMPREHENSIVE METABOLIC PANEL - Abnormal; Notable for the following components:   CO2 21 (*)    Albumin 3.4 (*)    All other components within normal limits  URINALYSIS, ROUTINE W REFLEX MICROSCOPIC - Abnormal; Notable for the following components:   Specific Gravity, Urine >1.046 (*)    Hgb urine dipstick MODERATE (*)    Ketones, ur 80 (*)    Bacteria, UA RARE (*)    All other components within normal limits  LIPASE, BLOOD  I-STAT BETA HCG BLOOD, ED ([facility], [facility], [facility] ONLY)    EKG None  Radiology Ct Abdomen Pelvis W Contrast  Result Date: 04/07/2018 CLINICAL DATA:  Right lower quadrant pain and elevated white blood cell count EXAM: CT ABDOMEN AND PELVIS WITH CONTRAST TECHNIQUE: Multidetector CT imaging of the abdomen and pelvis was performed using the standard protocol following bolus administration of intravenous contrast. CONTRAST:  28mL OMNIPAQUE IOHEXOL 300 MG/ML  SOLN COMPARISON:  October 29, 2014 FINDINGS: Lower chest: Lung bases are  clear. Hepatobiliary: No focal liver lesions are appreciable. Gallbladder wall is not appreciably thickened. There is no biliary duct dilatation. Pancreas: There is no pancreatic mass or inflammatory focus. Spleen: No splenic lesions are evident. Adrenals/Urinary Tract: Adrenals bilaterally appear unremarkable. Kidneys bilaterally show no evident mass or hydronephrosis on either side. There is no evident renal or ureteral calculus on either side. Urinary bladder is midline with wall thickness within normal limits. Stomach/Bowel: There is diffuse wall thickening in the distal ascending colon and hepatic flexure region immediately inferior to the gallbladder extending over a distance of approximately 4 cm. There is a focal  enhancing structure within this area measuring 8 x 8 mm, possibly an inflamed diverticulum. The adjacent mesentery is mildly thickened. There is no associated abscess or perforation in this area. No similar bowel wall thickening is noted elsewhere. There is no evident bowel obstruction. There is no free air or portal venous air. Vascular/Lymphatic: There is no abdominal aortic aneurysm. There is no appreciable vascular lesion. There is no adenopathy in the abdomen or pelvis. Reproductive: Uterus is anteverted. No evident pelvic mass. There is mildly loculated fluid in the dependent portion of the pelvis slightly to the right of midline. Other: No abscess or abdomen or pelvis. Appendix is not well seen. There is no periappendiceal region inflammation. There is no ascites beyond the loculated fluid in the right lower pelvis. Musculoskeletal: There is mid lumbar levoscoliosis. There is postoperative fixation in portions of the thoracic and lumbar spine with support hardware intact. There are no blastic or lytic bone lesions. There is no intramuscular or abdominal wall lesions. IMPRESSION: 1. Diffuse wall thickening in the distal ascending colon involving the hepatic flexure. Small focus of enhancement  in this area may represent an inflamed diverticulum. This area likely is due to colonic diverticulitis. Colitis of an infectious etiology is the major differential consideration in this age group. Neoplasm in this age group is atypical, although from a purely imaging standpoint, neoplasm could present in this manner. It may be prudent to consider direct visualization of this area after clinical symptoms resolve. 2. Mild amount of loculated fluid in the rightward aspect of the pelvis posteriorly, likely of inflammatory etiology. No pelvic mass evident. It is possible that this fluid in the right pelvis is due to recent ovarian cyst rupture. 3. No bowel obstruction. No abscess in the abdomen pelvis. No periappendiceal region inflammation is noted on this study. 4.  No appreciable renal or ureteral calculus.  No hydronephrosis. 5. The gallbladder wall does not appear appreciably thickened. Note that the colonic inflammation is located immediately inferior to the gallbladder. Electronically Signed   By: Bretta Bang III M.D.   On: 04/07/2018 11:02    Procedures Procedures (including critical care time)  Medications Ordered in ED Medications  morphine 2 MG/ML injection 2 mg (2 mg Intravenous Given 04/07/18 0943)  iohexol (OMNIPAQUE) 300 MG/ML solution 75 mL (75 mLs Intravenous Contrast Given 04/07/18 1047)  HYDROmorphone (DILAUDID) injection 0.5 mg (0.5 mg Intravenous Given 04/07/18 1211)     Initial Impression / Assessment and Plan / ED Course  I have reviewed the triage vital signs and the nursing notes.  Pertinent labs & imaging results that were available during my care of the patient were reviewed by me and considered in my medical decision making (see chart for details).  Ms.Forsman is a 28 yo F w/ hx of diverticulitis presenting with acute onset RLQ abdominal pain. Her prior history of diverticulosis was left side near the splenic flexure and this does not seem likely. Her location makes  appendicitis as more likely diagnosis. Other differential includes ovarian torsion, colitis, UTI, nephrolithiasis or ectopic pregnancy. Currently appears tearful and in distress due to pain. Will treat with pain control and get cbc, cmp, lipase, ua, poc pregnancy, CT Abd/pelvis.  Final Clinical Impressions(s) / ED Diagnoses   Final diagnoses:  Diverticulitis of large intestine without perforation or abscess without bleeding   Beta hCG negative. CMP unremarkable. Lipase WNL. CBC showing leukocytosis and CT Abd/Pelvis is showing diffuse wall thickening of distal ascending colon likely due to colonic diverticulitis. Currently  stable and able to tolerate fluid intake. She states she is agreeable to taking oral antibiotics at home. Discussed importance of following up with GI as recurrent diverticulitis in a such a young patient is quite unusual. She expressed understanding. Discharged with short term pain control, Cipro. Flagyl and referral for GI.   ED Discharge Orders         Ordered    Ambulatory referral to Gastroenterology     04/07/18 1146    ciprofloxacin (CIPRO) 500 MG tablet  Every 12 hours     04/07/18 1146    metroNIDAZOLE (FLAGYL) 500 MG tablet  3 times daily     04/07/18 1146    oxyCODONE-acetaminophen (PERCOCET/ROXICET) 5-325 MG tablet  Every 6 hours PRN     04/07/18 1146    polyethylene glycol (MIRALAX / GLYCOLAX) packet  Daily     04/07/18 1146           Theotis Barrio, MD 04/07/18 1330    Gerhard Munch, MD 04/09/18 [phone]

## 2018-04-07 NOTE — ED Triage Notes (Signed)
Pt states she has abdominal pain that started yesterday. She reports hx of diverticulitis and this feels the same. Tearful in triage.

## 2018-04-08 ENCOUNTER — Telehealth: Payer: Self-pay

## 2018-04-08 NOTE — Telephone Encounter (Signed)
Returned call, no answer, left vm 

## 2018-04-09 MED ORDER — NORGESTIMATE-ETH ESTRADIOL 0.25-35 MG-MCG PO TABS: 1.0000 | ORAL_TABLET | Freq: Every day | ORAL | 3 refills | Status: DC

## 2018-04-09 NOTE — Telephone Encounter (Signed)
LVM for pt to cb.

## 2018-04-09 NOTE — Addendum Note (Signed)
Addended by: Dalphine Handing on: 04/09/2018 10:30 AM   Modules accepted: Orders

## 2018-04-09 NOTE — Telephone Encounter (Signed)
Pt needs BC rf. She has not been seen since 2018 d/t insurance issues. Courtesy pack was sent 2 weeks ago until she found another provider. Pt found another provider; however, they r/s'd her appt until June d/t COVID 19 appt restrictions and they will not send BC rf because she's a new pt.    Consulted with Dr. Clearance Coots. Okay to send rf's until June.

## 2018-04-14 ENCOUNTER — Telehealth: Payer: Self-pay

## 2018-04-14 NOTE — Telephone Encounter (Signed)
Left VM message to call office.

## 2018-04-15 ENCOUNTER — Telehealth: Payer: Self-pay

## 2018-04-15 NOTE — Telephone Encounter (Signed)
Left VM message to call office regarding Refill request.   I need to verify what the patient needs: Dr. Clearance Coots sent 3 refills to [facility] on 04/09/2018.

## 2018-06-24 ENCOUNTER — Encounter: Payer: Self-pay | Admitting: Certified Nurse Midwife

## 2018-07-11 IMAGING — CT CT T SPINE W/O CM
2 of 19 series · 4 of 33 positions shown, 5 images · non-contrast
Comparison: CT abdomen pelvis [DATE]

CLINICAL DATA: Congenital kyphosis in the thoracic region.
Scoliosis. Mid to low back pain.

EXAM:
CT THORACIC AND LUMBAR SPINE WITHOUT CONTRAST
TECHNIQUE: Multidetector CT imaging of the thoracic and lumbar spine was
performed without contrast. Multiplanar CT image reconstructions
were also generated.

[Series 8: t spine bone · axial · 0.31mm/px · z∈[+104,+404]mm · 3 of 121 slices shown, 4 images]
[im 1/121  soft-tissue]
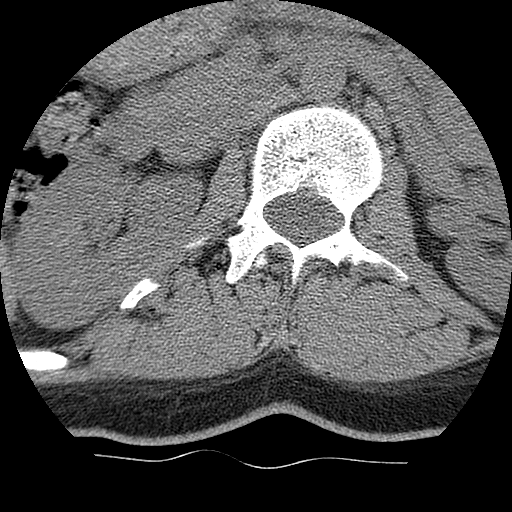
[im 1/121  bone]
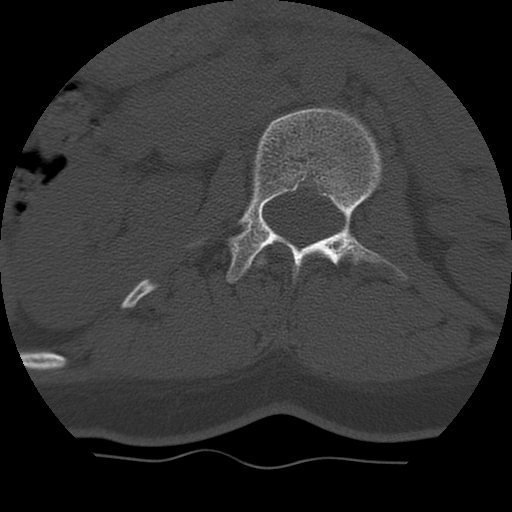
[im 61/121  bone]
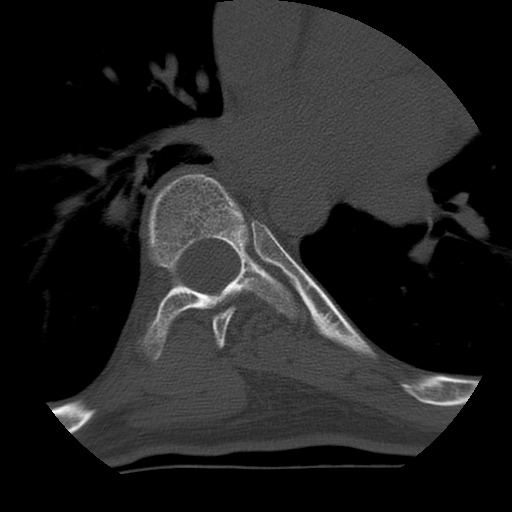
[im 121/121  bone]
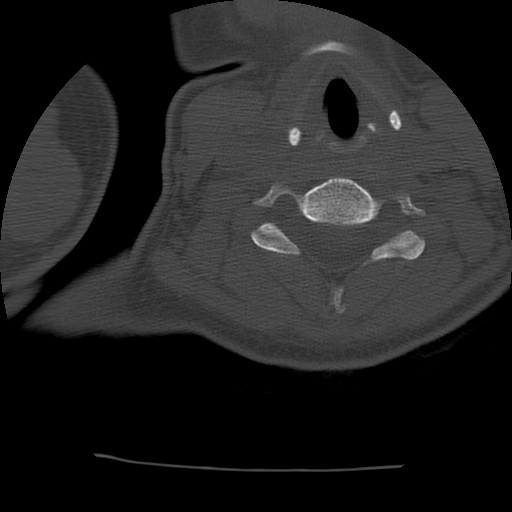

[Series 200: cor · coronal · 0.44mm/px · 1 of 56 slices shown]
[im 28/56  bone]
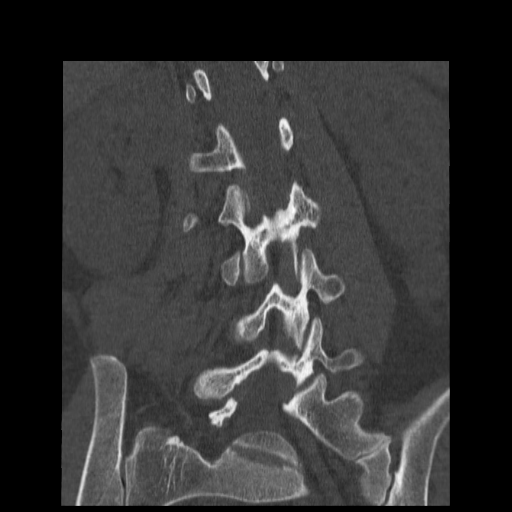

[4 of 33 positions shown; findings below may reference images not displayed]

FINDINGS: CT THORACIC SPINE FINDINGS

There is S shaped curvature convex to the left the upper thoracic
region and to the right in the mid thoracic region, both angles
approximately 30-35 degrees. There is a rotatory component of
approximately 30-35 degrees. There are no fused or congenitally
deficient vertebrae. There is facet arthropathy on the right most
pronounced at T3-4 but also present to a lesser degree at T2-3, T4-5
and T5-6. There is bilateral but left-sided predominant facet
degeneration at T10-11. No evidence of degenerative disc disease or
canal stenosis.

CT LUMBAR SPINE FINDINGS

Lumbar curvature convex to the left approximately 30 degrees. No
significant disc degeneration. No bulge or herniation. No canal or
foraminal stenosis. No facet arthropathy seen in the lumbar spine.
There is mild sacroiliac osteoarthritis.
IMPRESSION: CT THORACIC SPINE IMPRESSION

Curvature convex to the left in the upper thoracic region and to the
right in the mid to lower thoracic region. Right-sided facet
arthropathy in the upper lumbar region because of the curvature,
most pronounced at the T3-4 level. This could certainly be painful.
Lesser facet arthropathy in the lower thoracic region, most notable
at T10-11 left more than right.

CT LUMBAR SPINE IMPRESSION

Lumbar curvature convex to the left. No degenerative disc disease or
significant degenerative facet disease in the lumbar region. The
patient does have some early sacroiliac osteoarthritis.

## 2018-07-14 ENCOUNTER — Other Ambulatory Visit: Payer: Self-pay

## 2018-07-14 ENCOUNTER — Ambulatory Visit: Payer: 59 | Admitting: Certified Nurse Midwife

## 2018-07-14 ENCOUNTER — Encounter: Payer: Self-pay | Admitting: Certified Nurse Midwife

## 2018-07-14 VITALS — BP 106/68 | HR 70 | Temp 97.7°F | Resp 16 | Ht 64.5 in | Wt 145.0 lb

## 2018-07-14 DIAGNOSIS — Z3201 Encounter for pregnancy test, result positive: Secondary | ICD-10-CM

## 2018-07-14 DIAGNOSIS — Z3490 Encounter for supervision of normal pregnancy, unspecified, unspecified trimester: Secondary | ICD-10-CM

## 2018-07-14 DIAGNOSIS — N912 Amenorrhea, unspecified: Secondary | ICD-10-CM

## 2018-07-14 LAB — POCT URINE PREGNANCY: Preg Test, Ur: POSITIVE — AB

## 2018-07-14 NOTE — Progress Notes (Signed)
28 y.o. G0P0000 Married  Caucasian Fe here to establish gyn care for annual exam. Patient has been of  Oral contraception and had missed period. + UPT today. Patient was unaware she is pregnant. Denies nausea or vomiting, vaginal bleeding or cramping. Offered exam and declines. Will do pregnancy confirmation consult.   Patient's last menstrual period was 06/04/2018 (exact date).     [facility] 03/11/2019     Sexually active: Yes.    The current method of family planning is none.    Exercising: Yes.    walking Smoker:  no  Review of Systems  Constitutional: Negative.   HENT: Negative.   Eyes: Negative.   Respiratory: Negative.   Cardiovascular: Negative.   Gastrointestinal: Negative.   Genitourinary: Negative.   Musculoskeletal: Negative.   Skin: Negative.   Neurological: Negative.   Endo/Heme/Allergies: Negative.   Psychiatric/Behavioral: Negative.     Health Maintenance: Pap:  11-12-16 neg HPV HR +, 16,18/45 neg History of Abnormal Pap: no MMG:  none Self Breast exams: yes Colonoscopy:  none BMD:   None? TDaP: unsure Shingles: no Pneumonia: no Hep C and HIV: both neg 2016 Labs: poct upt-positive   reports that she has never smoked. She has never used smokeless tobacco. She reports that she does not drink alcohol or use drugs.  Past Medical History:  Diagnosis Date  . Scoliosis     Past Surgical History:  Procedure Laterality Date  . BREAST ENHANCEMENT SURGERY  08/2016  . SPINAL FUSION  09/2015    Current Outpatient Medications  Medication Sig Dispense Refill  . ibuprofen (ADVIL,MOTRIN) 200 MG tablet Take 800 mg by mouth every 6 (six) hours as needed.    . valACYclovir (VALTREX) 500 MG tablet Take 500 mg by mouth as needed. For outbreak     No current facility-administered medications for this visit.     Family History  Problem Relation Age of Onset  . Lupus Mother     ROS:  Pertinent items are noted in HPI.  Otherwise, a comprehensive ROS was negative.  Exam:    BP 106/68   Pulse 70   Temp 97.7 F (36.5 C) (Skin)   Resp 16   Ht 5' 4.5" (1.638 m)   Wt 145 lb (65.8 kg)   LMP 06/04/2018 (Exact Date)   BMI 24.50 kg/m  Height: 5' 4.5" (163.8 cm) Ht Readings from Last 3 Encounters:  07/14/18 5' 4.5" (1.638 m)  11/12/16 5\' 5"  (1.651 m)  10/29/14 5\' 3"  (1.6 m)    General appearance: alert, cooperative and appears stated age   A:  Well Woman with normal exam scheduled as a new patient, but positive UPT.  EDC  03/11/19 per dates. Approximately 6 weeks 4 days  Contraception use of OCP but was out of prescription   P:   Reviewed findings with patient and offered exam, but discussed she would have exam on her first OB visit. Patient prefers to do at [facility] visit. Discussed  Approximated gestational age and offered PUS here, but would prefer all at one time. Discussed importance of OB care and given OB provider list and recommended she call for appointment now. Discussed avoidance of alcohol, smoking, drug use in pregnancy. Avoid aspirin, Advil products, Valtrex use for HSV  during pregnancy. Discussed importance of small frequent meals to avoid nausea if possible. Expectations of breast tenderness, some more frequent urination and fatigue. Warning signs of bleeding,cramping, or persistent vomiting should be reported. Until she starts OB care will need to  call our office. Printed material given and discussed foods and acitivties not recommended during pregnancy. Will need pap smear follow up at first exam. Questions addressed at length. Wished well with pregnancy and would be happy to provide Gyn care after pregnancy. Patient thankful for information.  Rv prn   Face to face discussion regarding pregnancy and history review 33 minutes     An After Visit Summary was printed and given to the patient.

## 2018-07-15 ENCOUNTER — Encounter: Payer: Self-pay | Admitting: Certified Nurse Midwife

## 2018-08-02 LAB — OB RESULTS CONSOLE GC/CHLAMYDIA
Chlamydia: NEGATIVE
Gonorrhea: NEGATIVE

## 2018-08-02 LAB — OB RESULTS CONSOLE RUBELLA ANTIBODY, IGM: Rubella: IMMUNE

## 2018-08-02 LAB — OB RESULTS CONSOLE HEPATITIS B SURFACE ANTIGEN: Hepatitis B Surface Ag: NEGATIVE

## 2018-08-02 LAB — OB RESULTS CONSOLE ABO/RH: RH Type: NEGATIVE

## 2018-08-02 LAB — OB RESULTS CONSOLE RPR: RPR: NONREACTIVE

## 2018-08-02 LAB — OB RESULTS CONSOLE ANTIBODY SCREEN: Antibody Screen: NEGATIVE

## 2018-08-02 LAB — OB RESULTS CONSOLE HIV ANTIBODY (ROUTINE TESTING): HIV: NONREACTIVE

## 2019-01-21 NOTE — L&D Delivery Note (Signed)
Delivery Note At 12:37 PM a viable female was delivered via Vaginal, Spontaneous (Presentation: Left Occiput Anterior).  APGAR: 9, 9; weight  pending.   Placenta status: Spontaneous, Intact.  Cord: 3 vessels with the following complications: None.  Cord pH: not sent  Anesthesia: Epidural Episiotomy: None Lacerations: 1st degree;Periurethral Suture Repair: 3.0 vicryl Est. Blood Loss (mL):  200cc   It's a girl - "Natasha Hensley"!!   Mom to postpartum.  Baby to Couplet care / Skin to Skin.  Ranae Pila 03/08/2019, 12:59 PM

## 2019-02-16 LAB — OB RESULTS CONSOLE GBS: GBS: NEGATIVE

## 2019-02-28 ENCOUNTER — Encounter (HOSPITAL_COMMUNITY): Payer: Self-pay | Admitting: *Deleted

## 2019-02-28 ENCOUNTER — Telehealth (HOSPITAL_COMMUNITY): Payer: Self-pay | Admitting: *Deleted

## 2019-02-28 NOTE — Telephone Encounter (Signed)
Preadmission screen  

## 2019-03-04 NOTE — H&P (Signed)
Natasha Hensley is a 29 y.o. female presenting for IOL. Pregnancy uncomplicated except for RH negative. She has a hx spinal fusion T2-L2. Expecting a girl.    OB History    Gravida  1   Para  0   Term  0   Preterm  0   AB  0   Living  0     SAB  0   TAB  0   Ectopic  0   Multiple  0   Live Births             Past Medical History:  Diagnosis Date  . Diverticulitis   . Migraines   . Scoliosis   . STD (sexually transmitted disease)    HSV   Past Surgical History:  Procedure Laterality Date  . BREAST ENHANCEMENT SURGERY  08/2016  . SPINAL FUSION  09/2015   Family History: family history includes Diabetes in her father; Lupus in her mother; Skin cancer in her father. Social History:  reports that she has never smoked. She has never used smokeless tobacco. She reports that she does not drink alcohol or use drugs.     Maternal Diabetes: No Genetic Screening: Normal Maternal Ultrasounds/Referrals: Normal Fetal Ultrasounds or other Referrals:  None Maternal Substance Abuse:  No Significant Maternal Medications:  None Significant Maternal Lab Results:  None Other Comments:  None  Review of Systems History   Last menstrual period 06/04/2018. Exam Physical Exam  (from office) NAD, A&O NWOB Abd soft, nondistended, gravid  Prenatal labs: ABO, Rh: B/Negative/-- (07/13 0000) Antibody: Negative (07/13 0000) Rubella: Immune (07/13 0000) RPR: Nonreactive (07/13 0000)  HBsAg: Negative (07/13 0000)  HIV: Non-reactive (07/13 0000)  GBS: Negative/-- (01/27 0000)   Assessment/Plan: 29 yo G1P0 @ 39.3 presenting for IOL s/s elective.. Cervix unfavorable. Plan for cytotec followed by pitocin/AROM when more favorable.  GBS negative.   Ranae Pila 03/04/2019, 11:53 AM

## 2019-03-05 ENCOUNTER — Other Ambulatory Visit (HOSPITAL_COMMUNITY)
Admission: RE | Admit: 2019-03-05 | Discharge: 2019-03-05 | Disposition: A | Discharge status: Home / Self Care | Payer: 59 | Source: Ambulatory Visit | Attending: Obstetrics and Gynecology | Admitting: Obstetrics and Gynecology

## 2019-03-05 ENCOUNTER — Other Ambulatory Visit: Payer: Self-pay

## 2019-03-05 DIAGNOSIS — Z01812 Encounter for preprocedural laboratory examination: Secondary | ICD-10-CM | POA: Insufficient documentation

## 2019-03-05 DIAGNOSIS — Z20822 Contact with and (suspected) exposure to covid-19: Secondary | ICD-10-CM | POA: Insufficient documentation

## 2019-03-05 LAB — SARS CORONAVIRUS 2 (TAT 6-24 HRS): SARS Coronavirus 2: NEGATIVE

## 2019-03-08 ENCOUNTER — Inpatient Hospital Stay (HOSPITAL_COMMUNITY): Payer: 59 | Admitting: Anesthesiology

## 2019-03-08 ENCOUNTER — Encounter (HOSPITAL_COMMUNITY): Payer: Self-pay | Admitting: Anesthesiology

## 2019-03-08 ENCOUNTER — Encounter (HOSPITAL_COMMUNITY): Payer: Self-pay | Admitting: Obstetrics and Gynecology

## 2019-03-08 ENCOUNTER — Inpatient Hospital Stay (HOSPITAL_COMMUNITY)
Admission: AD | Admit: 2019-03-08 | Discharge: 2019-03-09 | DRG: 807 | Disposition: A | Discharge status: Home / Self Care | Payer: 59 | Attending: Obstetrics and Gynecology | Admitting: Obstetrics and Gynecology

## 2019-03-08 ENCOUNTER — Inpatient Hospital Stay (HOSPITAL_COMMUNITY): Payer: 59

## 2019-03-08 ENCOUNTER — Other Ambulatory Visit: Payer: Self-pay

## 2019-03-08 DIAGNOSIS — O26893 Other specified pregnancy related conditions, third trimester: Secondary | ICD-10-CM | POA: Diagnosis present

## 2019-03-08 DIAGNOSIS — Z6791 Unspecified blood type, Rh negative: Secondary | ICD-10-CM

## 2019-03-08 DIAGNOSIS — Z20822 Contact with and (suspected) exposure to covid-19: Secondary | ICD-10-CM | POA: Diagnosis present

## 2019-03-08 DIAGNOSIS — Z3490 Encounter for supervision of normal pregnancy, unspecified, unspecified trimester: Secondary | ICD-10-CM

## 2019-03-08 DIAGNOSIS — Z3A39 39 weeks gestation of pregnancy: Secondary | ICD-10-CM | POA: Diagnosis not present

## 2019-03-08 LAB — RPR: RPR Ser Ql: NONREACTIVE

## 2019-03-08 LAB — CBC
HCT: 33.8 % — ABNORMAL LOW (ref 36.0–46.0)
Hemoglobin: 11.5 g/dL — ABNORMAL LOW (ref 12.0–15.0)
MCH: 31 pg (ref 26.0–34.0)
MCHC: 34 g/dL (ref 30.0–36.0)
MCV: 91.1 fL (ref 80.0–100.0)
Platelets: 260 10*3/uL (ref 150–400)
RBC: 3.71 MIL/uL — ABNORMAL LOW (ref 3.87–5.11)
RDW: 13.8 % (ref 11.5–15.5)
WBC: 12.5 10*3/uL — ABNORMAL HIGH (ref 4.0–10.5)
nRBC: 0 % (ref 0.0–0.2)

## 2019-03-08 MED ORDER — SODIUM CHLORIDE (PF) 0.9 % IJ SOLN
INTRAMUSCULAR | Status: DC | PRN
  Administered 2019-03-08: 12 mL/h via EPIDURAL

## 2019-03-08 MED ORDER — MISOPROSTOL 25 MCG QUARTER TABLET
25.0000 ug | ORAL_TABLET | ORAL | Status: DC | PRN
  Administered 2019-03-08: 25 ug via VAGINAL
  Filled 2019-03-08: qty 1

## 2019-03-08 MED ORDER — OXYCODONE HCL 5 MG PO TABS: 10.0000 mg | ORAL_TABLET | ORAL | Status: DC | PRN

## 2019-03-08 MED ORDER — ZOLPIDEM TARTRATE 5 MG PO TABS: 5.0000 mg | ORAL_TABLET | Freq: Every evening | ORAL | Status: DC | PRN

## 2019-03-08 MED ORDER — ONDANSETRON HCL 4 MG PO TABS: 4.0000 mg | ORAL_TABLET | ORAL | Status: DC | PRN

## 2019-03-08 MED ORDER — WITCH HAZEL-GLYCERIN EX PADS: 1.0000 "application " | MEDICATED_PAD | CUTANEOUS | Status: DC | PRN

## 2019-03-08 MED ORDER — FENTANYL-BUPIVACAINE-NACL 0.5-0.125-0.9 MG/250ML-% EP SOLN
12.0000 mL/h | EPIDURAL | Status: DC | PRN
  Filled 2019-03-08: qty 250

## 2019-03-08 MED ORDER — DIPHENHYDRAMINE HCL 50 MG/ML IJ SOLN: 12.5000 mg | INTRAMUSCULAR | Status: DC | PRN

## 2019-03-08 MED ORDER — LACTATED RINGERS IV SOLN: INTRAVENOUS | Status: DC

## 2019-03-08 MED ORDER — IBUPROFEN 600 MG PO TABS
600.0000 mg | ORAL_TABLET | Freq: Four times a day (QID) | ORAL | Status: DC
  Administered 2019-03-08 – 2019-03-09 (×4): 600 mg via ORAL
  Filled 2019-03-08 (×4): qty 1

## 2019-03-08 MED ORDER — PHENYLEPHRINE 40 MCG/ML (10ML) SYRINGE FOR IV PUSH (FOR BLOOD PRESSURE SUPPORT): 80.0000 ug | PREFILLED_SYRINGE | INTRAVENOUS | Status: DC | PRN

## 2019-03-08 MED ORDER — OXYCODONE HCL 5 MG PO TABS
5.0000 mg | ORAL_TABLET | ORAL | Status: DC | PRN
  Administered 2019-03-08 – 2019-03-09 (×2): 5 mg via ORAL
  Filled 2019-03-08 (×3): qty 1

## 2019-03-08 MED ORDER — LACTATED RINGERS IV SOLN: 500.0000 mL | Freq: Once | INTRAVENOUS | Status: DC

## 2019-03-08 MED ORDER — ACETAMINOPHEN 325 MG PO TABS: 650.0000 mg | ORAL_TABLET | ORAL | Status: DC | PRN

## 2019-03-08 MED ORDER — BENZOCAINE-MENTHOL 20-0.5 % EX AERO
1.0000 "application " | INHALATION_SPRAY | CUTANEOUS | Status: DC | PRN
  Filled 2019-03-08 (×2): qty 56

## 2019-03-08 MED ORDER — ONDANSETRON HCL 4 MG/2ML IJ SOLN: 4.0000 mg | Freq: Four times a day (QID) | INTRAMUSCULAR | Status: DC | PRN

## 2019-03-08 MED ORDER — ONDANSETRON HCL 4 MG/2ML IJ SOLN
4.0000 mg | INTRAMUSCULAR | Status: DC | PRN
  Filled 2019-03-08: qty 2

## 2019-03-08 MED ORDER — BUTORPHANOL TARTRATE 1 MG/ML IJ SOLN: 1.0000 mg | INTRAMUSCULAR | Status: DC | PRN

## 2019-03-08 MED ORDER — EPHEDRINE 5 MG/ML INJ: 10.0000 mg | INTRAVENOUS | Status: DC | PRN

## 2019-03-08 MED ORDER — SENNOSIDES-DOCUSATE SODIUM 8.6-50 MG PO TABS
2.0000 | ORAL_TABLET | ORAL | Status: DC
  Administered 2019-03-08: 2 via ORAL
  Filled 2019-03-08: qty 2

## 2019-03-08 MED ORDER — TETANUS-DIPHTH-ACELL PERTUSSIS 5-2.5-18.5 LF-MCG/0.5 IM SUSP: 0.5000 mL | Freq: Once | INTRAMUSCULAR | Status: DC

## 2019-03-08 MED ORDER — PRENATAL MULTIVITAMIN CH
1.0000 | ORAL_TABLET | Freq: Every day | ORAL | Status: DC
  Filled 2019-03-08: qty 1

## 2019-03-08 MED ORDER — OXYTOCIN BOLUS FROM INFUSION
500.0000 mL | Freq: Once | INTRAVENOUS | Status: AC
  Administered 2019-03-08: 13:00:00 500 mL via INTRAVENOUS

## 2019-03-08 MED ORDER — SIMETHICONE 80 MG PO CHEW: 80.0000 mg | CHEWABLE_TABLET | ORAL | Status: DC | PRN

## 2019-03-08 MED ORDER — OXYTOCIN 40 UNITS IN NORMAL SALINE INFUSION - SIMPLE MED: 2.5000 [IU]/h | INTRAVENOUS | Status: DC

## 2019-03-08 MED ORDER — DIPHENHYDRAMINE HCL 25 MG PO CAPS: 25.0000 mg | ORAL_CAPSULE | Freq: Four times a day (QID) | ORAL | Status: DC | PRN

## 2019-03-08 MED ORDER — LIDOCAINE HCL (PF) 1 % IJ SOLN: 30.0000 mL | INTRAMUSCULAR | Status: DC | PRN

## 2019-03-08 MED ORDER — LACTATED RINGERS IV SOLN: 500.0000 mL | INTRAVENOUS | Status: DC | PRN

## 2019-03-08 MED ORDER — COCONUT OIL OIL: 1.0000 "application " | TOPICAL_OIL | Status: DC | PRN

## 2019-03-08 MED ORDER — LIDOCAINE HCL (PF) 1 % IJ SOLN
INTRAMUSCULAR | Status: DC | PRN
  Administered 2019-03-08: 8 mL via EPIDURAL

## 2019-03-08 MED ORDER — SOD CITRATE-CITRIC ACID 500-334 MG/5ML PO SOLN
30.0000 mL | ORAL | Status: DC | PRN
  Filled 2019-03-08: qty 30

## 2019-03-08 MED ORDER — HYDROXYZINE HCL 50 MG PO TABS: 50.0000 mg | ORAL_TABLET | Freq: Four times a day (QID) | ORAL | Status: DC | PRN

## 2019-03-08 MED ORDER — OXYTOCIN 40 UNITS IN NORMAL SALINE INFUSION - SIMPLE MED
1.0000 m[IU]/min | INTRAVENOUS | Status: DC
  Administered 2019-03-08: 2 m[IU]/min via INTRAVENOUS
  Filled 2019-03-08: qty 1000

## 2019-03-08 MED ORDER — DIBUCAINE (PERIANAL) 1 % EX OINT: 1.0000 "application " | TOPICAL_OINTMENT | CUTANEOUS | Status: DC | PRN

## 2019-03-08 MED ORDER — ACETAMINOPHEN 325 MG PO TABS
650.0000 mg | ORAL_TABLET | ORAL | Status: DC | PRN
  Administered 2019-03-08 – 2019-03-09 (×2): 650 mg via ORAL
  Filled 2019-03-08 (×2): qty 2

## 2019-03-08 MED ORDER — TERBUTALINE SULFATE 1 MG/ML IJ SOLN
0.2500 mg | Freq: Once | INTRAMUSCULAR | Status: AC | PRN
  Administered 2019-03-08: 0.25 mg via SUBCUTANEOUS
  Filled 2019-03-08: qty 1

## 2019-03-08 NOTE — Progress Notes (Signed)
Recurrent lates with moderate variability x 15 min not responsive to position change. Pitocin turned off Terbutaline given Placed in hands in knees Lates resolved and now good var without decels throughout several contractions after.  CTM closely SVE 7/100/0

## 2019-03-08 NOTE — Progress Notes (Signed)
C/c/+2 OA push

## 2019-03-08 NOTE — Anesthesia Procedure Notes (Signed)
Epidural Patient location during procedure: OB Start time: 03/08/2019 6:18 AM End time: 03/08/2019 6:22 AM  Staffing Anesthesiologist: Bethena Midget, MD  Preanesthetic Checklist Completed: patient identified, IV checked, site marked, risks and benefits discussed, surgical consent, monitors and equipment checked, pre-op evaluation and timeout performed  Epidural Patient position: sitting Prep: DuraPrep and site prepped and draped Patient monitoring: continuous pulse ox and blood pressure Approach: midline Location: L4-L5 Injection technique: LOR air  Needle:  Needle type: Tuohy  Needle gauge: 17 G Needle length: 9 cm and 9 Needle insertion depth: 9 cm Catheter type: closed end flexible Catheter size: 19 Gauge Catheter at skin depth: 14 cm Test dose: negative  Assessment Events: blood not aspirated, injection not painful, no injection resistance, no paresthesia and negative IV test

## 2019-03-08 NOTE — Anesthesia Preprocedure Evaluation (Signed)
Anesthesia Evaluation  Patient identified by MRN, date of birth, ID band Patient awake    Reviewed: Allergy & Precautions, H&P , NPO status , Patient's Chart, lab work & pertinent test results, reviewed documented beta blocker date and time   Airway Mallampati: I  TM Distance: >3 FB Neck ROM: full    Dental no notable dental hx. (+) Teeth Intact, Dental Advisory Given   Pulmonary neg pulmonary ROS,    Pulmonary exam normal breath sounds clear to auscultation       Cardiovascular negative cardio ROS Normal cardiovascular exam Rhythm:regular Rate:Normal     Neuro/Psych negative neurological ROS  negative psych ROS   GI/Hepatic negative GI ROS, Neg liver ROS,   Endo/Other  negative endocrine ROS  Renal/GU negative Renal ROS  negative genitourinary   Musculoskeletal   Abdominal   Peds  Hematology negative hematology ROS (+)   Anesthesia Other Findings   Reproductive/Obstetrics (+) Pregnancy                             Anesthesia Physical Anesthesia Plan  ASA: II  Anesthesia Plan: Epidural   Post-op Pain Management:    Induction:   PONV Risk Score and Plan:   Airway Management Planned:   Additional Equipment:   Intra-op Plan:   Post-operative Plan:   Informed Consent: I have reviewed the patients History and Physical, chart, labs and discussed the procedure including the risks, benefits and alternatives for the proposed anesthesia with the patient or authorized representative who has indicated his/her understanding and acceptance.     Dental Advisory Given  Plan Discussed with: Anesthesiologist  Anesthesia Plan Comments: (Labs checked- platelets confirmed with RN in room. Fetal heart tracing, per RN, reported to be stable enough for sitting procedure. Discussed epidural, and patient consents to the procedure:  included risk of possible headache,backache, failed block,  allergic reaction, and nerve injury. This patient was asked if she had any questions or concerns before the procedure started.)        Anesthesia Quick Evaluation  

## 2019-03-08 NOTE — Progress Notes (Signed)
Comf with CLE.  S/p cytotec x 1 and started on pitocin 4 hours after last dose AROM clear fluid SVE 6/100/-1 IUPC placed  Continue pitocin

## 2019-03-09 ENCOUNTER — Ambulatory Visit: Payer: Self-pay

## 2019-03-09 LAB — CBC
HCT: 27.9 % — ABNORMAL LOW (ref 36.0–46.0)
Hemoglobin: 9.2 g/dL — ABNORMAL LOW (ref 12.0–15.0)
MCH: 30.3 pg (ref 26.0–34.0)
MCHC: 33 g/dL (ref 30.0–36.0)
MCV: 91.8 fL (ref 80.0–100.0)
Platelets: 202 10*3/uL (ref 150–400)
RBC: 3.04 MIL/uL — ABNORMAL LOW (ref 3.87–5.11)
RDW: 14.2 % (ref 11.5–15.5)
WBC: 10.4 10*3/uL (ref 4.0–10.5)
nRBC: 0 % (ref 0.0–0.2)

## 2019-03-09 MED ORDER — IBUPROFEN 600 MG PO TABS: 600.0000 mg | ORAL_TABLET | Freq: Four times a day (QID) | ORAL | 0 refills | Status: DC | PRN

## 2019-03-09 MED ORDER — ACETAMINOPHEN 325 MG PO TABS: 650.0000 mg | ORAL_TABLET | Freq: Four times a day (QID) | ORAL | 0 refills | Status: DC | PRN

## 2019-03-09 MED ORDER — OXYCODONE HCL 5 MG PO TABS: 5.0000 mg | ORAL_TABLET | Freq: Four times a day (QID) | ORAL | 0 refills | Status: DC | PRN

## 2019-03-09 MED ORDER — RHO D IMMUNE GLOBULIN 1500 UNIT/2ML IJ SOSY
300.0000 ug | PREFILLED_SYRINGE | Freq: Once | INTRAMUSCULAR | Status: AC
  Administered 2019-03-09: 300 ug via INTRAVENOUS
  Filled 2019-03-09: qty 2

## 2019-03-09 MED ORDER — PRENATAL MULTIVITAMIN CH: 1.0000 | ORAL_TABLET | Freq: Every day | ORAL | 1 refills | Status: DC

## 2019-03-09 NOTE — Lactation Note (Signed)
This note was copied from a baby's chart. Lactation Consultation Note  Patient Name: Natasha Hensley [id number] Date: 03/09/2019 Reason for consult: Follow-up assessment;Primapara;1st time breastfeeding;Breast augmentation;Term Baby is 20 hours old , P 1  LC reviewed doc flow sheets and per mom the baby ate only for a few minutes after delivery and as not latched since.  LC reviewed potential feeding behaviors in the 1st 24 -48 hours.  LC reviewed hand expressing  A drop and spoon fed back to baby after getting baby to suck on a gloved finger. More rooting noted and then after several attempts baby latched with depth and with stimulation suckled for 8 mins and released on her own.  Per mom breast prior to implants was only a cup A , and with the implants removed the nipple and areola. Mom reports the breast were sensitive 1st part of pregnancy, no change in size or darkening of the areola. Last month of pregnancy breast were leaking. LC reassured that is a good sign and explained with breast surgery its always a wait and see for the milk volume.  LC recommended offering the breast with feeding cues. Appetizer if needed with spoon and work on the latching , post pump after feedings for extra stimulation because the baby has only eaten once and due to the breast surgery.  Dad Mentioned moms doctor said she could be D/C and LC recommended since the baby has only fed x 1 to stay for feeding assistance.  Maternal Data Has patient been taught Hand Expression?: Yes Does the patient have breastfeeding experience prior to this delivery?: No  Feeding Feeding Type: Breast Fed  LATCH Score Latch: Repeated attempts needed to sustain latch, nipple held in mouth throughout feeding, stimulation needed to elicit sucking reflex.  Audible Swallowing: A few with stimulation  Type of Nipple: Everted at rest and after stimulation  Comfort (Breast/Nipple): Soft / non-tender  Hold (Positioning): Assistance  needed to correctly position infant at breast and maintain latch.  LATCH Score: 7  Interventions Interventions: Breast feeding basics reviewed;Assisted with latch;Skin to skin;Breast massage;Hand express;Reverse pressure;Breast compression;Adjust position;Support pillows;Position options  Lactation Tools Discussed/Used     Consult Status Consult Status: Follow-up Date: 03/09/19 Follow-up type: In-patient    Matilde Sprang Keiton Cosma 03/09/2019, 9:21 AM

## 2019-03-09 NOTE — Lactation Note (Addendum)
This note was copied from a baby's chart. Lactation Consultation Note  Patient Name: Natasha Hensley DVOUZ'H Date: 03/09/2019 Reason for consult: Other (Comment);Follow-up assessment;Primapara;1st time breastfeeding;Breast augmentation(dyad going home early)  2nd [place] visit - per [facility] - dyad is going home early today.  Mom is exhausted and since am consult baby has had 3 bottles - 15, 18, and 30 ml / tolerated well.  LC recommended when at home and mom feels more rested to work in [place] if the baby latches, if not [place] sized mom for a NS #20 and #24 and the #20 NS fist best. LC provided a curved tip syringe to instill EBM or formula into the top so the baby has an appetizer and enhance a better deeper latch that is more comfortable . Let the baby feed for 15 -20 mins ( 30 mins max ) and then supplement with 30 ml of EBM or formula. Post pump both breast for 15 -20 mims / save milk for next feeding.  LC reviewed the reasons for post pumping ( breast implants ( nipple and areola removal ), DL.  LC was able to latch baby for 8 mins this am and noted at 1st mom had discomfort and ease off.  Sore nipple and engorgement prevention and tx reviewed.  LC instructed mom on the use of comfort gels after feedings,alternating to breast shells while awake and not sleeping, and hand pump.  DEBP kit and mom has a DEBP Medela at home.  LC offered to request and LC O/P appt and mom receptive to coming back next week. Mom aware the [facility] clinic would call her.   LC unable to assess another feeding due to baby having a bottle at 1304.     Maternal Data    Feeding Feeding Type: Formula Nipple Type: Slow - flow  LATCH Score                   Interventions Interventions: Breast feeding basics reviewed  Lactation Tools Discussed/Used Tools: Shells;Pump;Flanges;Comfort gels;Nipple Shields Nipple shield size: 20;24(sized  mom for NS incase she needs it when she is at home) Flange Size:  24 Shell Type: Inverted Breast pump type: Manual;Double-Electric Breast Pump Pump Review: Setup, frequency, and cleaning;Milk Storage Initiated by:: MAI Date initiated:: 03/09/19   Consult Status Consult Status: Follow-up(LC offered to request and a LC assessment due to breast implants , DL,) Date: (mom receptive and LC placed request in the Epic basket) Follow-up type: [place] 03/09/2019, 3:15 PM

## 2019-03-09 NOTE — Discharge Summary (Signed)
Obstetric Discharge Summary Reason for Admission: induction of labor Prenatal Procedures: none Intrapartum Procedures: spontaneous vaginal delivery Postpartum Procedures: none Complications-Operative and Postpartum: none Hemoglobin  Date Value Ref Range Status  03/09/2019 9.2 (L) 12.0 - 15.0 g/dL Final   HCT  Date Value Ref Range Status  03/09/2019 27.9 (L) 36.0 - 46.0 % Final    Physical Exam:  General: alert, cooperative and no distress Lochia: appropriate Uterine Fundus: firm Incision: healing well DVT Evaluation: No evidence of DVT seen on physical exam.  Discharge Diagnoses: Term Pregnancy-delivered  Discharge Information: Date: 03/09/2019 Activity: pelvic rest Diet: routine Medications: PNV, Ibuprofen and tylenol, oxycodone Condition: stable Instructions: refer to practice specific booklet Discharge to: home   Newborn Data: Live born female  Birth Weight: 7 lb 8.8 oz (3425 g) APGAR: 9, 9  Newborn Delivery   Birth date/time: 03/08/2019 12:37:00 Delivery type: Vaginal, Spontaneous      Home with mother.  Roselle Locus II 03/09/2019, 7:23 AM

## 2019-03-09 NOTE — Anesthesia Postprocedure Evaluation (Signed)
Anesthesia Post Note  Patient: Natasha Hensley  Procedure(s) Performed: AN AD HOC LABOR EPIDURAL     Patient location during evaluation: Mother Baby Anesthesia Type: Epidural Level of consciousness: awake and alert Pain management: pain level controlled Vital Signs Assessment: post-procedure vital signs reviewed and stable Respiratory status: spontaneous breathing, nonlabored ventilation and respiratory function stable Cardiovascular status: stable Postop Assessment: no headache, no backache and epidural receding Anesthetic complications: no    Last Vitals:  Vitals:   03/08/19 2335 03/09/19 0327  BP: 110/69 108/68  Pulse: 74 87  Resp: 16 16  Temp: 37.1 C 36.8 C  SpO2: 100% 100%    Last Pain:  Vitals:   03/09/19 0327  TempSrc: Oral  PainSc: 6    Pain Goal:                   Minerva Areola

## 2019-03-10 LAB — TYPE AND SCREEN
ABO/RH(D): B NEG
Antibody Screen: POSITIVE
Unit division: 0
Unit division: 0

## 2019-03-10 LAB — RH IG WORKUP (INCLUDES ABO/RH)
ABO/RH(D): B NEG
Fetal Screen: NEGATIVE
Gestational Age(Wks): 39.4
Unit division: 0

## 2019-03-10 LAB — BPAM RBC
Blood Product Expiration Date: 202103242359
Blood Product Expiration Date: 202103242359
Unit Type and Rh: 1700
Unit Type and Rh: 1700

## 2019-04-04 ENCOUNTER — Encounter: Payer: Self-pay | Admitting: Certified Nurse Midwife

## 2019-04-06 ENCOUNTER — Encounter: Payer: Self-pay | Admitting: Certified Nurse Midwife

## 2019-06-16 ENCOUNTER — Other Ambulatory Visit: Payer: Self-pay | Admitting: Physician Assistant

## 2019-06-16 DIAGNOSIS — H9319 Tinnitus, unspecified ear: Secondary | ICD-10-CM

## 2019-07-04 ENCOUNTER — Ambulatory Visit
Admission: RE | Admit: 2019-07-04 | Discharge: 2019-07-04 | Disposition: A | Discharge status: Home / Self Care | Payer: 59 | Source: Ambulatory Visit | Attending: Physician Assistant | Admitting: Physician Assistant

## 2019-07-04 ENCOUNTER — Encounter: Payer: Self-pay | Admitting: Radiology

## 2019-07-04 DIAGNOSIS — H9319 Tinnitus, unspecified ear: Secondary | ICD-10-CM

## 2019-07-04 MED ORDER — IOPAMIDOL (ISOVUE-370) INJECTION 76%
75.0000 mL | Freq: Once | INTRAVENOUS | Status: AC | PRN
  Administered 2019-07-04: 75 mL via INTRAVENOUS

## 2019-08-05 ENCOUNTER — Other Ambulatory Visit: Payer: Self-pay

## 2019-08-05 MED ORDER — VALACYCLOVIR HCL 500 MG PO TABS
ORAL_TABLET | ORAL | 1 refills | Status: DC
Start: 2019-08-05 — End: 2019-08-08

## 2019-08-08 ENCOUNTER — Other Ambulatory Visit: Payer: Self-pay

## 2019-08-08 MED ORDER — VALACYCLOVIR HCL 500 MG PO TABS: 500.0000 mg | ORAL_TABLET | Freq: Every day | ORAL | 1 refills | Status: DC

## 2020-04-16 ENCOUNTER — Other Ambulatory Visit: Payer: Self-pay | Admitting: Physician Assistant

## 2020-07-13 ENCOUNTER — Other Ambulatory Visit: Payer: Self-pay | Admitting: Physician Assistant

## 2020-10-18 ENCOUNTER — Ambulatory Visit (INDEPENDENT_AMBULATORY_CARE_PROVIDER_SITE_OTHER): Payer: Self-pay | Admitting: Physician Assistant

## 2020-10-18 ENCOUNTER — Other Ambulatory Visit: Payer: Self-pay

## 2020-10-18 DIAGNOSIS — L603 Nail dystrophy: Secondary | ICD-10-CM

## 2020-10-18 DIAGNOSIS — Z1283 Encounter for screening for malignant neoplasm of skin: Secondary | ICD-10-CM

## 2020-10-18 DIAGNOSIS — Z86018 Personal history of other benign neoplasm: Secondary | ICD-10-CM

## 2020-10-18 DIAGNOSIS — Z8619 Personal history of other infectious and parasitic diseases: Secondary | ICD-10-CM

## 2020-10-18 MED ORDER — VALACYCLOVIR HCL 500 MG PO TABS: 500.0000 mg | ORAL_TABLET | Freq: Every day | ORAL | 3 refills | Status: DC

## 2020-10-22 ENCOUNTER — Encounter: Payer: Self-pay | Admitting: Physician Assistant

## 2020-10-22 NOTE — Progress Notes (Signed)
   Follow-Up Visit   Subjective  Natasha Hensley is a 30 y.o. female who presents for the following: Annual Exam (Here for annual skin exam. Concerns right hair line. Also needs refill on valacyclovir. History of moderate mole left mid back. ). She is concerned about her toenails. They have been thick for years. No history of trauma. She had nail polish on at the time of the visit.  She would like to see a nail specialist.    The following portions of the chart were reviewed this encounter and updated as appropriate:  Tobacco  Allergies  Meds  Problems  Med Hx  Surg Hx  Fam Hx      Objective  Well appearing patient in no apparent distress; mood and affect are within normal limits.  All skin waist up examined.  all toenails All toe nails are extremely thick and dense.   Left [place] Lip, Right [place] Lip Clear today.  Waist up No atypical nevi No signs of non-mole skin cancer.    Assessment & Plan  Onychodystrophy all toenails  Will refer to nail specialist Beatrix Fetters in [place].  H/O cold sores (2) Left [place] Lip; Right Lower [place] Lip  valACYclovir (VALTREX) 500 MG tablet - Left Lower [place] Lip, Right Lower [place] Lip Take 1 tablet (500 mg total) by mouth daily. TAKE AS DIRECTED  Screening for malignant neoplasm of skin Waist up  Yearly skin exams.    I, Emillia Weatherly, PA-C, have reviewed all documentation's for this visit.  The documentation on 10/22/20 for the exam, diagnosis, procedures and orders are all accurate and complete.

## 2020-11-27 LAB — OB RESULTS CONSOLE ABO/RH: RH Type: NEGATIVE

## 2020-11-27 LAB — HEPATITIS C ANTIBODY: HCV Ab: NEGATIVE

## 2020-11-27 LAB — OB RESULTS CONSOLE RPR: RPR: NONREACTIVE

## 2020-11-27 LAB — OB RESULTS CONSOLE HIV ANTIBODY (ROUTINE TESTING): HIV: NONREACTIVE

## 2020-11-27 LAB — OB RESULTS CONSOLE HEPATITIS B SURFACE ANTIGEN: Hepatitis B Surface Ag: NEGATIVE

## 2020-11-27 LAB — OB RESULTS CONSOLE RUBELLA ANTIBODY, IGM: Rubella: IMMUNE

## 2020-11-27 LAB — OB RESULTS CONSOLE ANTIBODY SCREEN: Antibody Screen: NEGATIVE

## 2020-12-04 LAB — OB RESULTS CONSOLE GC/CHLAMYDIA
Chlamydia: NEGATIVE
Neisseria Gonorrhea: NEGATIVE

## 2021-01-20 NOTE — L&D Delivery Note (Signed)
Delivery Note At 8:57 AM a viable female was delivered via Vaginal, Spontaneous (Presentation:   LOA).  APGAR: pending; weight pending.   Placenta status: Spontaneous, Intact.  Cord: 3 vessels with the following complications: None.  Cord pH: n/a  Anesthesia: Local;Epidural Episiotomy: None Lacerations:  2nd degree and left periurethral Suture Repair: 3.0 vicryl rapide Est. Blood Loss (mL):  200  Mom to postpartum.  Baby to Couplet care / Skin to Skin.  Mitchel Honour 06/22/2021, 9:28 AM

## 2021-06-03 LAB — OB RESULTS CONSOLE GBS: GBS: POSITIVE

## 2021-06-12 ENCOUNTER — Encounter (HOSPITAL_COMMUNITY): Payer: Self-pay | Admitting: *Deleted

## 2021-06-12 ENCOUNTER — Telehealth (HOSPITAL_COMMUNITY): Payer: Self-pay | Admitting: *Deleted

## 2021-06-12 NOTE — Telephone Encounter (Signed)
Preadmission screen  

## 2021-06-14 ENCOUNTER — Encounter (HOSPITAL_COMMUNITY): Payer: Self-pay | Admitting: *Deleted

## 2021-06-22 ENCOUNTER — Inpatient Hospital Stay (HOSPITAL_COMMUNITY): Payer: 59 | Admitting: Anesthesiology

## 2021-06-22 ENCOUNTER — Encounter (HOSPITAL_COMMUNITY): Payer: Self-pay | Admitting: Obstetrics & Gynecology

## 2021-06-22 ENCOUNTER — Inpatient Hospital Stay (HOSPITAL_COMMUNITY)
Admission: AD | Admit: 2021-06-22 | Discharge: 2021-06-24 | DRG: 807 | Disposition: A | Discharge status: Home / Self Care | Payer: 59 | Attending: Obstetrics & Gynecology | Admitting: Obstetrics & Gynecology

## 2021-06-22 DIAGNOSIS — Z981 Arthrodesis status: Secondary | ICD-10-CM | POA: Diagnosis not present

## 2021-06-22 DIAGNOSIS — O99824 Streptococcus B carrier state complicating childbirth: Secondary | ICD-10-CM | POA: Diagnosis present

## 2021-06-22 DIAGNOSIS — Z3490 Encounter for supervision of normal pregnancy, unspecified, unspecified trimester: Principal | ICD-10-CM

## 2021-06-22 DIAGNOSIS — Z6791 Unspecified blood type, Rh negative: Secondary | ICD-10-CM | POA: Diagnosis not present

## 2021-06-22 DIAGNOSIS — O26893 Other specified pregnancy related conditions, third trimester: Principal | ICD-10-CM | POA: Diagnosis present

## 2021-06-22 DIAGNOSIS — Z3A38 38 weeks gestation of pregnancy: Secondary | ICD-10-CM

## 2021-06-22 LAB — CBC
HCT: 36.6 % (ref 36.0–46.0)
Hemoglobin: 12.5 g/dL (ref 12.0–15.0)
MCH: 29.3 pg (ref 26.0–34.0)
MCHC: 34.2 g/dL (ref 30.0–36.0)
MCV: 85.9 fL (ref 80.0–100.0)
Platelets: 269 10*3/uL (ref 150–400)
RBC: 4.26 MIL/uL (ref 3.87–5.11)
RDW: 14.4 % (ref 11.5–15.5)
WBC: 13.7 10*3/uL — ABNORMAL HIGH (ref 4.0–10.5)
nRBC: 0 % (ref 0.0–0.2)

## 2021-06-22 LAB — RPR: RPR Ser Ql: NONREACTIVE

## 2021-06-22 LAB — TYPE AND SCREEN
ABO/RH(D): B NEG
Antibody Screen: POSITIVE

## 2021-06-22 MED ORDER — BENZOCAINE-MENTHOL 20-0.5 % EX AERO
1.0000 "application " | INHALATION_SPRAY | CUTANEOUS | Status: DC | PRN
  Administered 2021-06-22: 1 via TOPICAL
  Filled 2021-06-22: qty 56

## 2021-06-22 MED ORDER — SENNOSIDES-DOCUSATE SODIUM 8.6-50 MG PO TABS: 2.0000 | ORAL_TABLET | Freq: Every day | ORAL | Status: DC

## 2021-06-22 MED ORDER — ZOLPIDEM TARTRATE 5 MG PO TABS: 5.0000 mg | ORAL_TABLET | Freq: Every evening | ORAL | Status: DC | PRN

## 2021-06-22 MED ORDER — ACETAMINOPHEN 325 MG PO TABS
650.0000 mg | ORAL_TABLET | ORAL | Status: DC | PRN
  Administered 2021-06-22 – 2021-06-23 (×4): 650 mg via ORAL
  Filled 2021-06-22 (×4): qty 2

## 2021-06-22 MED ORDER — FENTANYL CITRATE (PF) 100 MCG/2ML IJ SOLN
50.0000 ug | INTRAMUSCULAR | Status: DC | PRN
  Administered 2021-06-22: 50 ug via INTRAVENOUS
  Filled 2021-06-22 (×2): qty 2

## 2021-06-22 MED ORDER — OXYCODONE-ACETAMINOPHEN 5-325 MG PO TABS: 1.0000 | ORAL_TABLET | ORAL | Status: DC | PRN

## 2021-06-22 MED ORDER — ONDANSETRON HCL 4 MG PO TABS: 4.0000 mg | ORAL_TABLET | ORAL | Status: DC | PRN

## 2021-06-22 MED ORDER — PHENYLEPHRINE 80 MCG/ML (10ML) SYRINGE FOR IV PUSH (FOR BLOOD PRESSURE SUPPORT): 80.0000 ug | PREFILLED_SYRINGE | INTRAVENOUS | Status: DC | PRN

## 2021-06-22 MED ORDER — TETANUS-DIPHTH-ACELL PERTUSSIS 5-2.5-18.5 LF-MCG/0.5 IM SUSY: 0.5000 mL | PREFILLED_SYRINGE | Freq: Once | INTRAMUSCULAR | Status: DC

## 2021-06-22 MED ORDER — OXYTOCIN-SODIUM CHLORIDE 30-0.9 UT/500ML-% IV SOLN
2.5000 [IU]/h | INTRAVENOUS | Status: DC
  Filled 2021-06-22: qty 500

## 2021-06-22 MED ORDER — SODIUM CHLORIDE 0.9 % IV SOLN
1.0000 g | INTRAVENOUS | Status: DC
  Filled 2021-06-22: qty 1000

## 2021-06-22 MED ORDER — EPHEDRINE 5 MG/ML INJ
10.0000 mg | INTRAVENOUS | Status: DC | PRN
Start: 2021-06-22 — End: 2021-06-22

## 2021-06-22 MED ORDER — COCONUT OIL OIL: 1.0000 "application " | TOPICAL_OIL | Status: DC | PRN

## 2021-06-22 MED ORDER — ACETAMINOPHEN 325 MG PO TABS: 650.0000 mg | ORAL_TABLET | ORAL | Status: DC | PRN

## 2021-06-22 MED ORDER — DIPHENHYDRAMINE HCL 25 MG PO CAPS: 25.0000 mg | ORAL_CAPSULE | Freq: Four times a day (QID) | ORAL | Status: DC | PRN

## 2021-06-22 MED ORDER — DIPHENHYDRAMINE HCL 50 MG/ML IJ SOLN: 12.5000 mg | INTRAMUSCULAR | Status: DC | PRN

## 2021-06-22 MED ORDER — LIDOCAINE-EPINEPHRINE (PF) 2 %-1:200000 IJ SOLN
INTRAMUSCULAR | Status: DC | PRN
  Administered 2021-06-22: 10 mL via EPIDURAL

## 2021-06-22 MED ORDER — ONDANSETRON HCL 4 MG/2ML IJ SOLN: 4.0000 mg | Freq: Four times a day (QID) | INTRAMUSCULAR | Status: DC | PRN

## 2021-06-22 MED ORDER — DIBUCAINE (PERIANAL) 1 % EX OINT
1.0000 | TOPICAL_OINTMENT | CUTANEOUS | Status: DC | PRN
Start: 2021-06-22 — End: 2021-06-24

## 2021-06-22 MED ORDER — OXYCODONE-ACETAMINOPHEN 5-325 MG PO TABS: 2.0000 | ORAL_TABLET | ORAL | Status: DC | PRN

## 2021-06-22 MED ORDER — PHENYLEPHRINE 80 MCG/ML (10ML) SYRINGE FOR IV PUSH (FOR BLOOD PRESSURE SUPPORT)
80.0000 ug | PREFILLED_SYRINGE | INTRAVENOUS | Status: DC | PRN
Start: 2021-06-22 — End: 2021-06-22

## 2021-06-22 MED ORDER — LIDOCAINE HCL (PF) 1 % IJ SOLN
30.0000 mL | INTRAMUSCULAR | Status: AC | PRN
  Administered 2021-06-22: 30 mL via SUBCUTANEOUS
  Filled 2021-06-22: qty 30

## 2021-06-22 MED ORDER — LACTATED RINGERS IV SOLN: 500.0000 mL | INTRAVENOUS | Status: DC | PRN

## 2021-06-22 MED ORDER — SIMETHICONE 80 MG PO CHEW: 80.0000 mg | CHEWABLE_TABLET | ORAL | Status: DC | PRN

## 2021-06-22 MED ORDER — OXYTOCIN BOLUS FROM INFUSION
333.0000 mL | Freq: Once | INTRAVENOUS | Status: AC
  Administered 2021-06-22: 333 mL via INTRAVENOUS

## 2021-06-22 MED ORDER — IBUPROFEN 600 MG PO TABS
600.0000 mg | ORAL_TABLET | Freq: Four times a day (QID) | ORAL | Status: DC
  Administered 2021-06-22 – 2021-06-24 (×8): 600 mg via ORAL
  Filled 2021-06-22 (×9): qty 1

## 2021-06-22 MED ORDER — FENTANYL CITRATE (PF) 100 MCG/2ML IJ SOLN
INTRAMUSCULAR | Status: DC | PRN
Start: 2021-06-22 — End: 2021-06-22
  Administered 2021-06-22: 100 ug via EPIDURAL

## 2021-06-22 MED ORDER — SODIUM CHLORIDE 0.9 % IV SOLN: 2.0000 g | Freq: Once | INTRAVENOUS | Status: DC

## 2021-06-22 MED ORDER — WITCH HAZEL-GLYCERIN EX PADS: 1.0000 "application " | MEDICATED_PAD | CUTANEOUS | Status: DC | PRN

## 2021-06-22 MED ORDER — FENTANYL-BUPIVACAINE-NACL 0.5-0.125-0.9 MG/250ML-% EP SOLN
12.0000 mL/h | EPIDURAL | Status: DC | PRN
  Filled 2021-06-22: qty 250

## 2021-06-22 MED ORDER — SOD CITRATE-CITRIC ACID 500-334 MG/5ML PO SOLN: 30.0000 mL | ORAL | Status: DC | PRN

## 2021-06-22 MED ORDER — LACTATED RINGERS IV SOLN: 500.0000 mL | Freq: Once | INTRAVENOUS | Status: DC

## 2021-06-22 MED ORDER — AMPICILLIN SODIUM 2 G IJ SOLR
2.0000 g | Freq: Once | INTRAMUSCULAR | Status: AC
  Administered 2021-06-22: 2 g via INTRAVENOUS
  Filled 2021-06-22: qty 2000

## 2021-06-22 MED ORDER — PRENATAL MULTIVITAMIN CH
1.0000 | ORAL_TABLET | Freq: Every day | ORAL | Status: DC
  Filled 2021-06-22: qty 1

## 2021-06-22 MED ORDER — LACTATED RINGERS IV SOLN: INTRAVENOUS | Status: DC

## 2021-06-22 MED ORDER — EPHEDRINE 5 MG/ML INJ: 10.0000 mg | INTRAVENOUS | Status: DC | PRN

## 2021-06-22 MED ORDER — ONDANSETRON HCL 4 MG/2ML IJ SOLN: 4.0000 mg | INTRAMUSCULAR | Status: DC | PRN

## 2021-06-22 NOTE — H&P (Signed)
Natasha Hensley is a 31 y.o. female G2P1001 at 103w6d presenting for labor.  CTX started 2-3 am.  No LOF or VB.  Active FM.  H/o spinal fusion T2-L2 for scoliosis.  GBS positive.  Rh negative.    OB History     Gravida  2   Para  1   Term  1   Preterm  0   AB  0   Living  1      SAB  0   IAB  0   Ectopic  0   Multiple  0   Live Births  1          Past Medical History:  Diagnosis Date   Diverticulitis    Migraines    Scoliosis    STD (sexually transmitted disease)    HSV   Past Surgical History:  Procedure Laterality Date   BREAST ENHANCEMENT SURGERY  08/2016   SPINAL FUSION  09/2015   Family History: family history includes Diabetes in her father; Lupus in her mother; Skin cancer in her father. Social History:  reports that she has never smoked. She has never used smokeless tobacco. She reports that she does not drink alcohol and does not use drugs.     Maternal Diabetes: No Genetic Screening: Normal Maternal Ultrasounds/Referrals: Normal Fetal Ultrasounds or other Referrals:  None Maternal Substance Abuse:  No Significant Maternal Medications:  None Significant Maternal Lab Results:  Group B Strep positive Other Comments:  None  Review of Systems Maternal Medical History:  Reason for admission: Contractions.   Contractions: Onset was 3-5 hours ago.   Fetal activity: Perceived fetal activity is normal.   Prenatal complications: no prenatal complications Prenatal Complications - Diabetes: none.    Blood pressure 131/68, pulse 88, unknown if currently breastfeeding. Maternal Exam:  Uterine Assessment: Contraction strength is moderate.  Contraction frequency is regular.  Abdomen: Patient reports no abdominal tenderness. Fundal height is c/w dates.   Estimated fetal weight is 7#.     Fetal Exam Fetal Monitor Review: Baseline rate: 120.  Variability: moderate (6-25 bpm).   Pattern: accelerations present and no decelerations.   Fetal State  Assessment: Category I - tracings are normal.  Physical Exam Constitutional:      Appearance: Normal appearance.  HENT:     Head: Normocephalic and atraumatic.  Pulmonary:     Effort: Pulmonary effort is normal.  Abdominal:     Palpations: Abdomen is soft.  Musculoskeletal:        General: Normal range of motion.     Cervical back: Normal range of motion.  Skin:    General: Skin is warm and dry.  Neurological:     Mental Status: She is alert and oriented to person, place, and time.  Psychiatric:        Mood and Affect: Mood normal.        Behavior: Behavior normal.    Prenatal labs: ABO, Rh: B/Negative/-- (11/08 0000) Antibody: Negative (11/08 0000) Rubella: Immune (11/08 0000) RPR: Nonreactive (11/08 0000)  HBsAg: Negative (11/08 0000)  HIV: Non-reactive (11/08 0000)  GBS: Positive/-- (05/15 0000)   Assessment/Plan: 31yo G2P1001 at [redacted]w[redacted]d for labor -Hoping for CLEA; labs being drawn now -Amp for GBS ppx -Anticipate NSVD   Linda Hedges 06/22/2021, 8:05 AM

## 2021-06-22 NOTE — Anesthesia Preprocedure Evaluation (Signed)
Anesthesia Evaluation  Patient identified by MRN, date of birth, ID band Patient awake    Reviewed: Allergy & Precautions, Patient's Chart, lab work & pertinent test results  Airway Mallampati: II  TM Distance: >3 FB Neck ROM: Full    Dental no notable dental hx.    Pulmonary neg pulmonary ROS,    Pulmonary exam normal breath sounds clear to auscultation       Cardiovascular negative cardio ROS Normal cardiovascular exam Rhythm:Regular Rate:Normal     Neuro/Psych  Headaches, negative psych ROS   GI/Hepatic negative GI ROS, Neg liver ROS,   Endo/Other  negative endocrine ROS  Renal/GU negative Renal ROS  negative genitourinary   Musculoskeletal S/p scoliosis surgery 2017   Abdominal   Peds negative pediatric ROS (+)  Hematology negative hematology ROS (+) Hb 12.5, plt 269   Anesthesia Other Findings   Reproductive/Obstetrics (+) Pregnancy                             Anesthesia Physical Anesthesia Plan  ASA: 2  Anesthesia Plan: Epidural   Post-op Pain Management:    Induction:   PONV Risk Score and Plan: 2  Airway Management Planned: Natural Airway  Additional Equipment: None  Intra-op Plan:   Post-operative Plan:   Informed Consent: I have reviewed the patients History and Physical, chart, labs and discussed the procedure including the risks, benefits and alternatives for the proposed anesthesia with the patient or authorized representative who has indicated his/her understanding and acceptance.       Plan Discussed with:   Anesthesia Plan Comments: (Successful epidural at L4-5 in 2021. Advised pt still possible that epidural will be difficult to place this time and may not work well- still wants to proceed. Also 8cm upon arrival to L+D)        Anesthesia Quick Evaluation

## 2021-06-22 NOTE — Lactation Note (Signed)
This note was copied from a baby's chart. Lactation Consultation Note  Patient Name: Natasha Hensley [id number] Date: 06/22/2021 Reason for consult: Difficult latch Age:33 hours  Infant transferred to floor following stay in NICU. Infant fed DBM last feeding at 1600 took 25 ml with emesis afterwards as per parents.  Mom to call for latch assistance with next feeding.   Mom has two electric pumps at home.   All questions answered at the end of the visit.   Maternal Data    Feeding Mother's Current Feeding Choice: Breast Milk and Donor Milk Nipple Type: Slow - flow  LATCH Score                    Lactation Tools Discussed/Used    Interventions Interventions: Breast feeding basics reviewed;Expressed milk;Education;Pace feeding;Infant Driven Feeding Algorithm education  Discharge Pump: Personal  Consult Status Consult Status: Follow-up Date: 06/23/21 Follow-up type: In-patient    Ghada Abbett  N[place] 06/22/2021, 6:23 PM

## 2021-06-22 NOTE — Anesthesia Postprocedure Evaluation (Signed)
Anesthesia Post Note  Patient: Natasha Hensley  Procedure(s) Performed: AN AD HOC LABOR EPIDURAL     Patient location during evaluation: Mother Baby Anesthesia Type: Epidural Level of consciousness: awake Pain management: satisfactory to patient Vital Signs Assessment: post-procedure vital signs reviewed and stable Respiratory status: spontaneous breathing Cardiovascular status: stable Anesthetic complications: no   No notable events documented.  Last Vitals:  Vitals:   06/22/21 1155 06/22/21 1305  BP: 123/75 105/65  Pulse:  72  Resp: 16 16  Temp: 36.9 C 37.2 C  SpO2: 100% 100%    Last Pain:  Vitals:   06/22/21 1305  TempSrc: Axillary  PainSc: 4    Pain Goal:                   [employer]

## 2021-06-22 NOTE — MAU Note (Signed)
.  Natasha Hensley is a 31 y.o. at [redacted]w[redacted]d here in MAU reporting: contractions that started at 0200 that became 2-3 minutes within the past hour.  Pt reports GBS + status.  Cervical exam by Ernestene Kiel., RN at 8 cm.  Pt transferred to L&D with Christy Gentles, DO at bedside.    Onset of complaint: 0200 Pain score: 10  Vitals:   06/22/21 0752 06/22/21 0756  BP: 131/68 131/68  Pulse: 88 88     FHT:125 Lab orders placed from triage:   n/a

## 2021-06-22 NOTE — Anesthesia Procedure Notes (Signed)
Epidural Patient location during procedure: OB Start time: 06/22/2021 8:37 AM End time: 06/22/2021 8:55 AM  Staffing Anesthesiologist: Lannie Fields, DO Performed: anesthesiologist   Preanesthetic Checklist Completed: patient identified, IV checked, risks and benefits discussed, monitors and equipment checked, pre-op evaluation and timeout performed  Epidural Patient position: sitting Prep: DuraPrep and site prepped and draped Patient monitoring: continuous pulse ox, blood pressure, heart rate and cardiac monitor Approach: midline Location: L4-L5 Injection technique: LOR air  Needle:  Needle type: Tuohy  Needle gauge: 17 G Needle length: 9 cm Needle insertion depth: 8 cm Catheter type: closed end flexible Catheter size: 19 Gauge Catheter at skin depth: 15 cm Test dose: negative  Assessment Sensory level: T8 Events: blood not aspirated, injection not painful, no injection resistance, no paresthesia and negative IV test  Additional Notes Large spinal scar from T2-L2 rods s/p scoliosis surgery 2017. Successful epidural at L4-5 in 2021. Spine still very scoliotic, difficult to palpate and spinous processes below scar. Reason for block:procedure for pain

## 2021-06-23 LAB — CBC
HCT: 35.8 % — ABNORMAL LOW (ref 36.0–46.0)
Hemoglobin: 11.8 g/dL — ABNORMAL LOW (ref 12.0–15.0)
MCH: 29.1 pg (ref 26.0–34.0)
MCHC: 33 g/dL (ref 30.0–36.0)
MCV: 88.4 fL (ref 80.0–100.0)
Platelets: 236 10*3/uL (ref 150–400)
RBC: 4.05 MIL/uL (ref 3.87–5.11)
RDW: 14.6 % (ref 11.5–15.5)
WBC: 11.3 10*3/uL — ABNORMAL HIGH (ref 4.0–10.5)
nRBC: 0 % (ref 0.0–0.2)

## 2021-06-23 MED ORDER — RHO D IMMUNE GLOBULIN 1500 UNIT/2ML IJ SOSY
300.0000 ug | PREFILLED_SYRINGE | Freq: Once | INTRAMUSCULAR | Status: AC
  Administered 2021-06-23: 300 ug via INTRAMUSCULAR
  Filled 2021-06-23: qty 2

## 2021-06-23 NOTE — Lactation Note (Signed)
This note was copied from a baby's chart. Lactation Consultation Note  Patient Name: Natasha Hensley [id number] Date: 06/23/2021 Reason for consult: Follow-up assessment;Difficult latch;Early term 37-38.6wks;Breastfeeding assistance Age:33 hours  P2, Early Term, 2% WL  LC entered the room and mom was holding baby. Mom requested that LC come back later and stated that she did NOT request to be seen today by anyone from lactation. LC let mom know that if she does not want to be seen daily, she can choose to be seen as needed. Mom stated that she would like to be seen by lactation PRN.   LC told mom that she would make a note in her chart.   Maternal Data    Feeding Nipple Type: Nfant Standard Flow (white)  LATCH Score                    Lactation Tools Discussed/Used    Interventions    Discharge    Consult Status Consult Status: PRN Date: 06/23/21 Follow-up type: Call as needed    [employer] 06/23/2021, 5:03 PM

## 2021-06-23 NOTE — Progress Notes (Signed)
Post Partum Day 1 Subjective: no complaints, up ad lib, voiding, and tolerating PO.  Requests circ.  Unable to have early d/c for inadequate GBS tx; rapid delivery.  Objective: Blood pressure 118/73, pulse 69, temperature 98.2 F (36.8 C), temperature source Oral, resp. rate 18, SpO2 99 %, unknown if currently breastfeeding.  Physical Exam:  General: alert, cooperative, and appears stated age Lochia: appropriate Uterine Fundus: firm Incision: healing well, no significant drainage DVT Evaluation: No evidence of DVT seen on physical exam. Negative Homan's sign. No cords or calf tenderness.  Recent Labs    06/22/21 0802 06/23/21 0517  HGB 12.5 11.8*  HCT 36.6 35.8*    Assessment/Plan: Plan for discharge tomorrow, Breastfeeding, and Circumcision prior to discharge Circ-patient is counseled re: risk of bleeding, infection, and scarring.  All questions were answered and patient wishes to proceed.   LOS: 1 day   Mitchel Honour 06/23/2021, 7:09 AM

## 2021-06-24 LAB — RH IG WORKUP (INCLUDES ABO/RH)
Fetal Screen: NEGATIVE
Gestational Age(Wks): 38.6
Unit division: 0

## 2021-06-24 MED ORDER — IBUPROFEN 600 MG PO TABS: 600.0000 mg | ORAL_TABLET | Freq: Four times a day (QID) | ORAL | 0 refills | Status: DC | PRN

## 2021-06-24 NOTE — Discharge Summary (Signed)
Postpartum Discharge Summary       Patient Name: Natasha Hensley DOB: 1990-02-25 MRN: [id number]  Date of admission: 06/22/2021 Delivery date:06/22/2021  Delivering provider: Linda Hedges  Date of discharge: 06/24/2021  Admitting diagnosis: Pregnancy [Z34.90] Intrauterine pregnancy: [redacted]w[redacted]d     Secondary diagnosis:  Principal Problem:   Pregnancy  Additional problems:      Discharge diagnosis: Term Pregnancy Delivered                                              Post partum procedures:   Augmentation: N/A Complications: None  Hospital course: Onset of Labor With Vaginal Delivery      31 y.o. yo [id number] at [redacted]w[redacted]d was admitted in Active Labor on 06/22/2021. Patient had an uncomplicated labor course as follows:  Membrane Rupture Time/Date: 8:18 AM ,06/22/2021   Delivery Method:Vaginal, Spontaneous  Episiotomy: None  Lacerations:  Periurethral  Patient had an uncomplicated postpartum course.  She is ambulating, tolerating a regular diet, passing flatus, and urinating well. Patient is discharged home in stable condition on 06/24/21.  Newborn Data: Birth date:06/22/2021  Birth time:8:57 AM  Gender:Female  Living status:Living  Apgars:9 ,9  Weight:3220 g   Magnesium Sulfate received: No BMZ received: No Rhophylac:N/A MMR:N/A T-DaP:Given prenatally Flu: N/A Transfusion:No  Physical exam  Vitals:   06/23/21 0504 06/23/21 1802 06/23/21 2116 06/24/21 0745  BP: 118/73 110/74 116/72 106/72  Pulse: 69 65 73 74  Resp: $Remo'18 20 18   'kQowV$ Temp: 98.2 F (36.8 C) 97.9 F (36.6 C) 97.9 F (36.6 C) 97.9 F (36.6 C)  TempSrc: Oral  Oral Oral  SpO2: 99%  100% 99%   General: alert, cooperative, and no distress Lochia: appropriate Uterine Fundus: firm Incision: N/A DVT Evaluation: No evidence of DVT seen on physical exam. Labs: Lab Results  Component Value Date   WBC 11.3 (H) 06/23/2021   HGB 11.8 (L) 06/23/2021   HCT 35.8 (L) 06/23/2021   MCV 88.4 06/23/2021   PLT 236 06/23/2021       Latest Ref Rng & Units 04/07/2018    8:44 AM  CMP  Glucose 70 - 99 mg/dL 96    BUN 6 - 20 mg/dL 8    Creatinine 0.44 - 1.00 mg/dL 0.78    Sodium 135 - 145 mmol/L 136    Potassium 3.5 - 5.1 mmol/L 3.8    Chloride 98 - 111 mmol/L 107    CO2 22 - 32 mmol/L 21    Calcium 8.9 - 10.3 mg/dL 8.9    Total Protein 6.5 - 8.1 g/dL 6.9    Total Bilirubin 0.3 - 1.2 mg/dL 0.9    Alkaline Phos 38 - 126 U/L 57    AST 15 - 41 U/L 17    ALT 0 - 44 U/L 13     Edinburgh Score:    06/24/2021    9:06 AM  Edinburgh Postnatal Depression Scale Screening Tool  I have been able to laugh and see the funny side of things. 0  I have looked forward with enjoyment to things. 0  I have blamed myself unnecessarily when things went wrong. 1  I have been anxious or worried for no good reason. 0  I have felt scared or panicky for no good reason. 0  Things have been getting on top of me. 0  I have been  so unhappy that I have had difficulty sleeping. 0  I have felt sad or miserable. 0  I have been so unhappy that I have been crying. 1  The thought of harming myself has occurred to me. 0  Edinburgh Postnatal Depression Scale Total 2      After visit meds:  Allergies as of 06/24/2021       Reactions   Hydrocodone Nausea Only        Medication List     TAKE these medications    acetaminophen 325 MG tablet Commonly known as: Tylenol Take 2 tablets (650 mg total) by mouth every 6 (six) hours as needed (for pain scale < 4).   ibuprofen 600 MG tablet Commonly known as: ADVIL Take 1 tablet (600 mg total) by mouth every 6 (six) hours as needed. What changed: Another medication with the same name was added. Make sure you understand how and when to take each.   ibuprofen 600 MG tablet Commonly known as: ADVIL Take 1 tablet (600 mg total) by mouth every 6 (six) hours as needed. What changed: You were already taking a medication with the same name, and this prescription was added. Make sure you  understand how and when to take each.   multivitamin with minerals tablet Take 1 tablet by mouth daily.   oxyCODONE 5 MG immediate release tablet Commonly known as: Oxy IR/ROXICODONE Take 1 tablet (5 mg total) by mouth every 6 (six) hours as needed (pain scale 4-7).   prenatal multivitamin Tabs tablet Take 1 tablet by mouth daily at 12 noon.   valACYclovir 500 MG tablet Commonly known as: VALTREX Take 1 tablet (500 mg total) by mouth daily. TAKE AS DIRECTED         Discharge home in stable condition Infant Feeding: Breast Infant Disposition:home with mother Discharge instruction: per After Visit Summary and Postpartum booklet. Activity: Advance as tolerated. Pelvic rest for 6 weeks.  Diet: routine diet Anticipated Birth Control: Unsure Postpartum Appointment:6 weeks Additional Postpartum F/U:    Future Appointments:No future appointments. Follow up Visit:      06/24/2021 Luz Lex, MD

## 2021-06-26 ENCOUNTER — Inpatient Hospital Stay (HOSPITAL_COMMUNITY): Payer: 59

## 2021-06-26 ENCOUNTER — Inpatient Hospital Stay (HOSPITAL_COMMUNITY): Admission: AD | Admit: 2021-06-26 | Payer: 59 | Source: Home / Self Care | Admitting: Obstetrics and Gynecology

## 2021-07-02 ENCOUNTER — Telehealth (HOSPITAL_COMMUNITY): Payer: Self-pay | Admitting: *Deleted

## 2021-07-02 NOTE — Telephone Encounter (Signed)
Attempted Hospital Discharge Follow-Up Call.  Left voice mail requesting that patient return RN's phone call.  

## 2022-01-20 NOTE — L&D Delivery Note (Signed)
Delivery Note At 7:55 PM a viable female was delivered via Vaginal, Spontaneous (Presentation: Right Occiput Anterior).  APGAR: 9, 9; weight pending.   Placenta status: Spontaneous, Intact.  Cord: 3 vessels with the following complications: None.    Anesthesia: Epidural Episiotomy: None Lacerations: Vaginal outlet Suture Repair: 3.0 vicryl Est. Blood Loss (mL): 175  Mom to postpartum.  Baby to Couplet care / Skin to Skin.  Tawni Levy 10/18/2022, 8:15 PM

## 2022-03-28 LAB — OB RESULTS CONSOLE RUBELLA ANTIBODY, IGM: Rubella: IMMUNE

## 2022-03-28 LAB — OB RESULTS CONSOLE GC/CHLAMYDIA
Chlamydia: NEGATIVE
Neisseria Gonorrhea: NEGATIVE

## 2022-03-28 LAB — OB RESULTS CONSOLE HEPATITIS B SURFACE ANTIGEN: Hepatitis B Surface Ag: NEGATIVE

## 2022-03-28 LAB — HEPATITIS C ANTIBODY: HCV Ab: NEGATIVE

## 2022-03-28 LAB — OB RESULTS CONSOLE HIV ANTIBODY (ROUTINE TESTING): HIV: NONREACTIVE

## 2022-03-28 LAB — OB RESULTS CONSOLE RPR: RPR: NONREACTIVE

## 2022-09-30 LAB — OB RESULTS CONSOLE GBS: GBS: NEGATIVE

## 2022-10-18 ENCOUNTER — Inpatient Hospital Stay (HOSPITAL_COMMUNITY): Payer: Self-pay | Admitting: Anesthesiology

## 2022-10-18 ENCOUNTER — Inpatient Hospital Stay (HOSPITAL_COMMUNITY)
Admission: AD | Admit: 2022-10-18 | Discharge: 2022-10-20 | DRG: 807 | Disposition: A | Discharge status: Home / Self Care | Payer: Self-pay | Attending: Obstetrics and Gynecology | Admitting: Obstetrics and Gynecology

## 2022-10-18 ENCOUNTER — Encounter (HOSPITAL_COMMUNITY): Payer: Self-pay

## 2022-10-18 ENCOUNTER — Other Ambulatory Visit: Payer: Self-pay

## 2022-10-18 DIAGNOSIS — Z23 Encounter for immunization: Secondary | ICD-10-CM | POA: Diagnosis not present

## 2022-10-18 DIAGNOSIS — Z981 Arthrodesis status: Secondary | ICD-10-CM

## 2022-10-18 DIAGNOSIS — Z3A38 38 weeks gestation of pregnancy: Secondary | ICD-10-CM | POA: Diagnosis not present

## 2022-10-18 DIAGNOSIS — O26893 Other specified pregnancy related conditions, third trimester: Principal | ICD-10-CM | POA: Diagnosis present

## 2022-10-18 DIAGNOSIS — Z3490 Encounter for supervision of normal pregnancy, unspecified, unspecified trimester: Principal | ICD-10-CM

## 2022-10-18 DIAGNOSIS — Z833 Family history of diabetes mellitus: Secondary | ICD-10-CM

## 2022-10-18 DIAGNOSIS — Z6791 Unspecified blood type, Rh negative: Secondary | ICD-10-CM

## 2022-10-18 LAB — CBC
HCT: 32.2 % — ABNORMAL LOW (ref 36.0–46.0)
Hemoglobin: 10.9 g/dL — ABNORMAL LOW (ref 12.0–15.0)
MCH: 30.7 pg (ref 26.0–34.0)
MCHC: 33.9 g/dL (ref 30.0–36.0)
MCV: 90.7 fL (ref 80.0–100.0)
Platelets: 222 10*3/uL (ref 150–400)
RBC: 3.55 MIL/uL — ABNORMAL LOW (ref 3.87–5.11)
RDW: 14.1 % (ref 11.5–15.5)
WBC: 11.4 10*3/uL — ABNORMAL HIGH (ref 4.0–10.5)
nRBC: 0 % (ref 0.0–0.2)

## 2022-10-18 LAB — TYPE AND SCREEN
ABO/RH(D): B NEG
Antibody Screen: POSITIVE

## 2022-10-18 LAB — HIV ANTIBODY (ROUTINE TESTING W REFLEX): HIV Screen 4th Generation wRfx: NONREACTIVE

## 2022-10-18 MED ORDER — DIPHENHYDRAMINE HCL 50 MG/ML IJ SOLN: 12.5000 mg | INTRAMUSCULAR | Status: DC | PRN

## 2022-10-18 MED ORDER — LIDOCAINE HCL (PF) 1 % IJ SOLN: 30.0000 mL | INTRAMUSCULAR | Status: DC | PRN

## 2022-10-18 MED ORDER — OXYTOCIN-SODIUM CHLORIDE 30-0.9 UT/500ML-% IV SOLN
2.5000 [IU]/h | INTRAVENOUS | Status: DC
  Filled 2022-10-18: qty 500

## 2022-10-18 MED ORDER — BUPIVACAINE HCL (PF) 0.25 % IJ SOLN
INTRAMUSCULAR | Status: DC | PRN
Start: 2022-10-18 — End: 2022-10-18
  Administered 2022-10-18: 8 mL via EPIDURAL

## 2022-10-18 MED ORDER — LACTATED RINGERS IV SOLN: INTRAVENOUS | Status: DC

## 2022-10-18 MED ORDER — LACTATED RINGERS IV SOLN: 500.0000 mL | INTRAVENOUS | Status: DC | PRN

## 2022-10-18 MED ORDER — HYDROXYZINE HCL 50 MG PO TABS: 50.0000 mg | ORAL_TABLET | Freq: Four times a day (QID) | ORAL | Status: DC | PRN

## 2022-10-18 MED ORDER — WITCH HAZEL-GLYCERIN EX PADS: 1.0000 | MEDICATED_PAD | CUTANEOUS | Status: DC | PRN

## 2022-10-18 MED ORDER — OXYCODONE HCL 5 MG PO TABS
5.0000 mg | ORAL_TABLET | ORAL | Status: DC | PRN
  Administered 2022-10-19: 5 mg via ORAL
  Filled 2022-10-18: qty 1

## 2022-10-18 MED ORDER — PHENYLEPHRINE 80 MCG/ML (10ML) SYRINGE FOR IV PUSH (FOR BLOOD PRESSURE SUPPORT): 80.0000 ug | PREFILLED_SYRINGE | INTRAVENOUS | Status: DC | PRN

## 2022-10-18 MED ORDER — ZOLPIDEM TARTRATE 5 MG PO TABS: 5.0000 mg | ORAL_TABLET | Freq: Every evening | ORAL | Status: DC | PRN

## 2022-10-18 MED ORDER — ACETAMINOPHEN 325 MG PO TABS: 650.0000 mg | ORAL_TABLET | ORAL | Status: DC | PRN

## 2022-10-18 MED ORDER — SENNOSIDES-DOCUSATE SODIUM 8.6-50 MG PO TABS
2.0000 | ORAL_TABLET | Freq: Every day | ORAL | Status: DC
  Administered 2022-10-19: 2 via ORAL
  Filled 2022-10-18 (×2): qty 2

## 2022-10-18 MED ORDER — SOD CITRATE-CITRIC ACID 500-334 MG/5ML PO SOLN
30.0000 mL | ORAL | Status: DC | PRN
  Administered 2022-10-18: 30 mL via ORAL
  Filled 2022-10-18: qty 30

## 2022-10-18 MED ORDER — ACETAMINOPHEN 325 MG PO TABS
650.0000 mg | ORAL_TABLET | ORAL | Status: DC | PRN
  Administered 2022-10-19: 650 mg via ORAL
  Filled 2022-10-18: qty 2

## 2022-10-18 MED ORDER — SIMETHICONE 80 MG PO CHEW: 80.0000 mg | CHEWABLE_TABLET | ORAL | Status: DC | PRN

## 2022-10-18 MED ORDER — DIPHENHYDRAMINE HCL 25 MG PO CAPS: 25.0000 mg | ORAL_CAPSULE | Freq: Four times a day (QID) | ORAL | Status: DC | PRN

## 2022-10-18 MED ORDER — FENTANYL-BUPIVACAINE-NACL 0.5-0.125-0.9 MG/250ML-% EP SOLN
EPIDURAL | Status: AC
  Filled 2022-10-18: qty 250

## 2022-10-18 MED ORDER — ONDANSETRON HCL 4 MG PO TABS: 4.0000 mg | ORAL_TABLET | ORAL | Status: DC | PRN

## 2022-10-18 MED ORDER — TETANUS-DIPHTH-ACELL PERTUSSIS 5-2.5-18.5 LF-MCG/0.5 IM SUSY: 0.5000 mL | PREFILLED_SYRINGE | Freq: Once | INTRAMUSCULAR | Status: DC

## 2022-10-18 MED ORDER — FENTANYL CITRATE (PF) 100 MCG/2ML IJ SOLN
INTRAMUSCULAR | Status: AC
  Filled 2022-10-18: qty 2

## 2022-10-18 MED ORDER — ONDANSETRON HCL 4 MG/2ML IJ SOLN: 4.0000 mg | Freq: Four times a day (QID) | INTRAMUSCULAR | Status: DC | PRN

## 2022-10-18 MED ORDER — EPHEDRINE 5 MG/ML INJ: 10.0000 mg | INTRAVENOUS | Status: DC | PRN

## 2022-10-18 MED ORDER — OXYCODONE-ACETAMINOPHEN 5-325 MG PO TABS: 1.0000 | ORAL_TABLET | ORAL | Status: DC | PRN

## 2022-10-18 MED ORDER — DIBUCAINE (PERIANAL) 1 % EX OINT: 1.0000 | TOPICAL_OINTMENT | CUTANEOUS | Status: DC | PRN

## 2022-10-18 MED ORDER — OXYCODONE-ACETAMINOPHEN 5-325 MG PO TABS: 2.0000 | ORAL_TABLET | ORAL | Status: DC | PRN

## 2022-10-18 MED ORDER — PRENATAL MULTIVITAMIN CH
1.0000 | ORAL_TABLET | Freq: Every day | ORAL | Status: DC
  Filled 2022-10-18: qty 1

## 2022-10-18 MED ORDER — LACTATED RINGERS IV SOLN: 500.0000 mL | Freq: Once | INTRAVENOUS | Status: DC

## 2022-10-18 MED ORDER — ONDANSETRON HCL 4 MG/2ML IJ SOLN: 4.0000 mg | INTRAMUSCULAR | Status: DC | PRN

## 2022-10-18 MED ORDER — FENTANYL-BUPIVACAINE-NACL 0.5-0.125-0.9 MG/250ML-% EP SOLN
EPIDURAL | Status: DC | PRN
  Administered 2022-10-18: 12 mL/h via EPIDURAL

## 2022-10-18 MED ORDER — TERBUTALINE SULFATE 1 MG/ML IJ SOLN: 0.2500 mg | Freq: Once | INTRAMUSCULAR | Status: DC | PRN

## 2022-10-18 MED ORDER — FENTANYL-BUPIVACAINE-NACL 0.5-0.125-0.9 MG/250ML-% EP SOLN: 12.0000 mL/h | EPIDURAL | Status: DC | PRN

## 2022-10-18 MED ORDER — FENTANYL CITRATE (PF) 100 MCG/2ML IJ SOLN
INTRAMUSCULAR | Status: DC | PRN
  Administered 2022-10-18: 100 ug via EPIDURAL

## 2022-10-18 MED ORDER — MISOPROSTOL 25 MCG QUARTER TABLET: 25.0000 ug | ORAL_TABLET | ORAL | Status: DC | PRN

## 2022-10-18 MED ORDER — BUPIVACAINE HCL (PF) 0.25 % IJ SOLN
INTRAMUSCULAR | Status: DC | PRN
  Administered 2022-10-18: 1.2 mL via INTRATHECAL

## 2022-10-18 MED ORDER — IBUPROFEN 600 MG PO TABS
600.0000 mg | ORAL_TABLET | Freq: Four times a day (QID) | ORAL | Status: DC
  Administered 2022-10-19 (×3): 600 mg via ORAL
  Filled 2022-10-18 (×5): qty 1

## 2022-10-18 MED ORDER — OXYTOCIN BOLUS FROM INFUSION
333.0000 mL | Freq: Once | INTRAVENOUS | Status: AC
  Administered 2022-10-18: 333 mL via INTRAVENOUS

## 2022-10-18 MED ORDER — BENZOCAINE-MENTHOL 20-0.5 % EX AERO
1.0000 | INHALATION_SPRAY | CUTANEOUS | Status: DC | PRN
  Administered 2022-10-19: 1 via TOPICAL
  Filled 2022-10-18: qty 56

## 2022-10-18 MED ORDER — COCONUT OIL OIL: 1.0000 | TOPICAL_OIL | Status: DC | PRN

## 2022-10-18 NOTE — H&P (Signed)
Natasha Hensley is a 32 y.o. female presenting in active labor. Contractions started this AM, got stronger through the day. Cervix was 3cm in the office Thursday > 6cm in MAU.  Has hx of spinal fusion for scoliosis and hx of divertiliculitis x2. She is Rh neg.  OB History     Gravida  3   Para  2   Term  2   Preterm  0   AB  0   Living  2      SAB  0   IAB  0   Ectopic  0   Multiple  0   Live Births  2          PMH reviewed: as above  Past Surgical History:  Procedure Laterality Date   BREAST ENHANCEMENT SURGERY  08/2016   SPINAL FUSION  09/2015   Family History: family history includes Diabetes in her father; Lupus in her mother; Skin cancer in her father. Social History:  reports that she has never smoked. She has never used smokeless tobacco. She reports that she does not drink alcohol and does not use drugs.     Maternal Diabetes: No Genetic Screening: Normal Maternal Ultrasounds/Referrals: Normal Fetal Ultrasounds or other Referrals:  None Maternal Substance Abuse:  No Significant Maternal Medications:  None Significant Maternal Lab Results:  Group B Strep negative and Rh negative Number of Prenatal Visits:greater than 3 verified prenatal visits Maternal Vaccinations:TDap Other Comments:  None     10/18/2022    4:53 PM 10/18/2022    4:38 PM 10/18/2022    4:33 PM  Vitals with BMI  Height 5\' 5"  5\' 5"    Weight 177 lbs 177 lbs   BMI 29.45 29.45   Systolic  117 123  Diastolic  70 72  Pulse  89 81   Constitutional:      Appearance: Normal appearance.  HENT:     Head: Normocephalic.  Eyes:     Pupils: Pupils are equal, round Cardiovascular:     Rate and Rhythm: Normal rate     Pulses: Normal pulses.  Abdominal:     General: Abdomen is Gravid, nontender Neurological:     Mental Status: She is alert.  Dilation: 6 Effacement (%): 90 Station: 0 Exam by::  Valla Leaver, RN) Blood pressure 117/70, pulse 89, temperature 97.6 F (36.4 C),  temperature source Oral, resp. rate 18, height 5\' 5"  (1.651 m), weight 80.3 kg, SpO2 99%, unknown if currently breastfeeding.  Prenatal labs: labs reviewed in [place], admission labs pending ABO, Rh:   Antibody:   Rubella:   RPR:    HBsAg:    HIV:    GBS:   GBS neg  Assessment/Plan: Natasha Hensley is a 32 y.o. G3P2002 at [redacted]w[redacted]d presenting in active labor. Cervix 6cm > for epidural then AROM. Rh neg - for rhogam pp if indicated GBS neg    Tawni Levy 10/18/2022, 5:00 PM

## 2022-10-18 NOTE — MAU Note (Addendum)
.  Natasha Hensley is a 32 y.o. at Unknown here in MAU reporting: contractions 5-8 minutes since 0800 today. Denies VB, LOF. Endorses positive FM.  Onset of complaint: 10/18/22 at 0800 Pain score: 8/10 Vitals:   10/18/22 1635 10/18/22 1638  BP:  117/70  Pulse:  89  Resp:    Temp:    SpO2: 99%      FHT:125

## 2022-10-18 NOTE — Progress Notes (Signed)
Labor Progress Note  Feeling more comfortable after spinal. Cervix 7/90/0, AROMed with consent - clear fluid. FHT remains cat 1. Contracting, will defer pitocin at this time.  Jule Economy, MD

## 2022-10-18 NOTE — Anesthesia Preprocedure Evaluation (Addendum)
Anesthesia Evaluation  Patient identified by MRN, date of birth, ID band Patient awake    Reviewed: Allergy & Precautions, Patient's Chart, lab work & pertinent test results  Airway Mallampati: II  TM Distance: >3 FB Neck ROM: Full    Dental no notable dental hx.    Pulmonary neg pulmonary ROS   Pulmonary exam normal breath sounds clear to auscultation       Cardiovascular negative cardio ROS Normal cardiovascular exam Rhythm:Regular Rate:Normal     Neuro/Psych  Headaches  negative psych ROS   GI/Hepatic negative GI ROS, Neg liver ROS,,,  Endo/Other  negative endocrine ROS    Renal/GU negative Renal ROS  negative genitourinary   Musculoskeletal T2-L2 spinal fusion 2017   Abdominal   Peds negative pediatric ROS (+)  Hematology negative hematology ROS (+)   Anesthesia Other Findings   Reproductive/Obstetrics (+) Pregnancy                             Anesthesia Physical Anesthesia Plan  ASA: 3  Anesthesia Plan: Combined Spinal and Epidural   Post-op Pain Management:    Induction:   PONV Risk Score and Plan: 2  Airway Management Planned: Natural Airway  Additional Equipment: None  Intra-op Plan:   Post-operative Plan:   Informed Consent: I have reviewed the patients History and Physical, chart, labs and discussed the procedure including the risks, benefits and alternatives for the proposed anesthesia with the patient or authorized representative who has indicated his/her understanding and acceptance.       Plan Discussed with:   Anesthesia Plan Comments: (Long discussion with patient about the possibility of failed/patchy epidural in patients w/ hx large spinal fusion. Looking back at her records, epidural successfully placed at L4-5 with her first labor, although pt recounts numerous painful attempts. 2nd labor, epidural attempted by myself however patient delivered  precipitously during attempt.  Will proceed w/ CSE for confirmation of placement and to ensure pain relief for 1-1.5h w/ spinal.)       Anesthesia Quick Evaluation

## 2022-10-18 NOTE — Anesthesia Procedure Notes (Addendum)
Epidural Patient location during procedure: OB Start time: 10/18/2022 5:10 PM End time: 10/18/2022 5:20 PM  Staffing Anesthesiologist: Lannie Fields, DO Performed: anesthesiologist   Preanesthetic Checklist Completed: patient identified, IV checked, risks and benefits discussed, monitors and equipment checked, pre-op evaluation and timeout performed  Epidural Patient position: sitting Prep: DuraPrep and site prepped and draped Patient monitoring: continuous pulse ox, blood pressure, heart rate and cardiac monitor Approach: midline Location: L4-L5 Injection technique: LOR air  Needle:  Needle type: Tuohy  Needle gauge: 17 G Needle length: 9 cm Needle insertion depth: 6 cm Catheter type: closed end flexible Catheter size: 19 Gauge Catheter at skin depth: 12 cm Test dose: negative  Assessment Sensory level: T8 Events: blood not aspirated, no cerebrospinal fluid, injection not painful, no injection resistance, no paresthesia and negative IV test  Additional Notes Patient identified. Risks/Benefits/Options discussed with patient including but not limited to bleeding, infection, nerve damage, paralysis, failed block, incomplete pain control, headache, blood pressure changes, nausea, vomiting, reactions to medication both or allergic, itching and postpartum back pain. Confirmed with bedside nurse the patient's most recent platelet count. Confirmed with patient that they are not currently taking any anticoagulation, have any bleeding history or any family history of bleeding disorders. Patient expressed understanding and wished to proceed. All questions were answered. Sterile technique was used throughout the entire procedure. Please see nursing notes for vital signs. Test dose was given through epidural catheter and negative prior to continuing to dose epidural or start infusion. Warning signs of high block given to the patient including shortness of breath, tingling/numbness in  hands, complete motor block, or any concerning symptoms with instructions to call for help. Patient was given instructions on fall risk and not to get out of bed. All questions and concerns addressed with instructions to call with any issues or inadequate analgesia.     Long discussion w/ patient about the possibility of failed or patchy epidural with hx of large spinal fusion. Epidural placed at very end of scar (T2-L2). Unable to palpate any landmarks. CSE performed with 24G pencan through tuohy, clear CSF, no issues. Epidural catheter threaded easily.Reason for block:procedure for pain

## 2022-10-19 LAB — RPR: RPR Ser Ql: NONREACTIVE

## 2022-10-19 LAB — CBC
HCT: 30.5 % — ABNORMAL LOW (ref 36.0–46.0)
Hemoglobin: 10.5 g/dL — ABNORMAL LOW (ref 12.0–15.0)
MCH: 30.9 pg (ref 26.0–34.0)
MCHC: 34.4 g/dL (ref 30.0–36.0)
MCV: 89.7 fL (ref 80.0–100.0)
Platelets: 213 10*3/uL (ref 150–400)
RBC: 3.4 MIL/uL — ABNORMAL LOW (ref 3.87–5.11)
RDW: 14.1 % (ref 11.5–15.5)
WBC: 12.8 10*3/uL — ABNORMAL HIGH (ref 4.0–10.5)
nRBC: 0 % (ref 0.0–0.2)

## 2022-10-19 MED ORDER — RHO D IMMUNE GLOBULIN 1500 UNIT/2ML IJ SOSY
300.0000 ug | PREFILLED_SYRINGE | Freq: Once | INTRAMUSCULAR | Status: AC
  Administered 2022-10-19: 300 ug via INTRAVENOUS
  Filled 2022-10-19: qty 2

## 2022-10-19 NOTE — Progress Notes (Signed)
Postpartum Progress Note  S: No complaints. Feeling well. Lochia appropriate. No subjective fevers/chills.   O:     10/19/2022    8:10 AM 10/19/2022    3:00 AM 10/18/2022   11:01 PM  Vitals with BMI  Systolic 112 89 116  Diastolic 72 68 78  Pulse 74 68 66    Gen: NAD, A&O Pulm: NWOB Abd: soft, appropriately ttp, fundus firm and below Umb Ext: No evidence of DVT, trace edema b/l  Labs Recent Results (from the past 2160 hour(s))  OB RESULT CONSOLE Group B Strep     Status: None   Collection Time: 09/30/22 12:00 AM  Result Value Ref Range   GBS Negative   Type and screen [facility]     Status: None   Collection Time: 10/18/22  4:40 PM  Result Value Ref Range   ABO/RH(D) B NEG    Antibody Screen POS    Sample Expiration 10/21/2022,2359    Antibody Identification      PASSIVELY ACQUIRED ANTI-D Performed at [facility], [address] [place], [place] [address]   CBC     Status: Abnormal   Collection Time: 10/18/22  4:41 PM  Result Value Ref Range   WBC 11.4 (H) 4.0 - 10.5 K/uL   RBC 3.55 (L) 3.87 - 5.11 MIL/uL   Hemoglobin 10.9 (L) 12.0 - 15.0 g/dL   HCT 60.4 (L) 54.0 - 98.1 %   MCV 90.7 80.0 - 100.0 fL   MCH 30.7 26.0 - 34.0 pg   MCHC 33.9 30.0 - 36.0 g/dL   RDW 19.1 47.8 - 29.5 %   Platelets 222 150 - 400 K/uL   nRBC 0.0 0.0 - 0.2 %    Comment: Performed at [facility], [address] [place], [place] [address]  HIV Antibody (routine testing w rflx)     Status: None   Collection Time: 10/18/22  4:46 PM  Result Value Ref Range   HIV Screen 4th Generation wRfx Non Reactive Non Reactive    Comment: Performed at [facility], [address] [place], [place] [address]  CBC     Status: Abnormal   Collection Time: 10/19/22  4:50 AM  Result Value Ref Range   WBC 12.8 (H) 4.0 - 10.5 K/uL   RBC 3.40 (L) 3.87 - 5.11 MIL/uL   Hemoglobin 10.5 (L) 12.0 - 15.0 g/dL   HCT 46.9 (L) 62.9 - 52.8 %   MCV 89.7 80.0 - 100.0 fL   MCH 30.9  26.0 - 34.0 pg   MCHC 34.4 30.0 - 36.0 g/dL   RDW 41.3 24.4 - 01.0 %   Platelets 213 150 - 400 K/uL   nRBC 0.0 0.0 - 0.2 %    Comment: Performed at [facility], [address], [place], [place] [address]  Rh IG workup (includes ABO/Rh)     Status: None (Preliminary result)   Collection Time: 10/19/22  4:50 AM  Result Value Ref Range   Gestational Age(Wks) 38.5    Fetal Screen      NEG Performed at [facility], [address], [place], [place] [address]    Unit Number [id number]    Blood Component Type RHIG    Unit division 00    Status of Unit ALLOCATED    Transfusion Status OK TO TRANSFUSE      A/P:  PPD1 s/p SVD, doing well pp. AFVSS. Benign exam.  Rh neg -  for rhogam as baby Pricilla Riffle is Rh pos Continue present care. Plan for d/c likely PPD#2.   Jule Economy, MD

## 2022-10-19 NOTE — Plan of Care (Signed)
°  Problem: Education: Goal: Knowledge of condition will improve Outcome: Completed/Met

## 2022-10-19 NOTE — Anesthesia Postprocedure Evaluation (Signed)
Anesthesia Post Note  Patient: Natasha Hensley  Procedure(s) Performed: AN AD HOC LABOR EPIDURAL     Patient location during evaluation: Mother Baby Anesthesia Type: Epidural Level of consciousness: awake and alert Pain management: pain level controlled Vital Signs Assessment: post-procedure vital signs reviewed and stable Respiratory status: spontaneous breathing, nonlabored ventilation and respiratory function stable Cardiovascular status: stable Postop Assessment: no headache, no backache and epidural receding Anesthetic complications: no   No notable events documented.  Last Vitals:  Vitals:   10/18/22 2301 10/19/22 0300  BP: 116/78 (!) 89/68  Pulse: 66 68  Resp: 18 18  Temp: 36.9 C (!) 36.3 C  SpO2:      Last Pain:  Vitals:   10/19/22 0406  TempSrc:   PainSc: Asleep   Pain Goal:                   [place] Area

## 2022-10-20 LAB — RH IG WORKUP (INCLUDES ABO/RH)
Fetal Screen: NEGATIVE
Gestational Age(Wks): 38.5
Unit division: 0

## 2022-10-20 MED ORDER — INFLUENZA VIRUS VACC SPLIT PF (FLUZONE) 0.5 ML IM SUSY: 0.5000 mL | PREFILLED_SYRINGE | INTRAMUSCULAR | Status: DC

## 2022-10-20 MED ORDER — ACETAMINOPHEN 325 MG PO TABS: 650.0000 mg | ORAL_TABLET | ORAL | 0 refills | Status: AC | PRN

## 2022-10-20 MED ORDER — INFLUENZA VIRUS VACC SPLIT PF (FLUZONE) 0.5 ML IM SUSY
0.5000 mL | PREFILLED_SYRINGE | INTRAMUSCULAR | Status: AC
  Administered 2022-10-20: 0.5 mL via INTRAMUSCULAR

## 2022-10-20 MED ORDER — IBUPROFEN 600 MG PO TABS: 600.0000 mg | ORAL_TABLET | Freq: Four times a day (QID) | ORAL | 0 refills | Status: AC

## 2022-10-20 NOTE — Progress Notes (Signed)
Postpartum Progress Note  Post Partum Day 2 s/p spontaneous vaginal delivery.  Patient reports well-controlled pain, ambulating without difficulty, voiding spontaneously, tolerating PO.  Vaginal bleeding is appropriate.   Objective: Blood pressure (!) 94/55, pulse 61, temperature 98.1 F (36.7 C), temperature source Oral, resp. rate 16, height 5\' 5"  (1.651 m), weight 80.3 kg, SpO2 99%, unknown if currently breastfeeding.  Physical Exam:  General: alert and no distress Lochia: appropriate Uterine Fundus: firm DVT Evaluation: No evidence of DVT seen on physical exam.  Recent Labs    10/18/22 1641 10/19/22 0450  HGB 10.9* 10.5*  HCT 32.2* 30.5*    Assessment/Plan: Postpartum Day 2, s/p vaginal delivery. Continue routine postpartum care Rh negative, s/p Rhogam Lactation following Anticipate discharge home today.    LOS: 2 days   Natasha Hensley 10/20/2022, 7:59 AM

## 2022-10-20 NOTE — Discharge Summary (Signed)
Obstetric Discharge Summary  Natasha Hensley is a 32 y.o. female that presented on 10/18/2022 for active labor. Her labor course was uncomplicated and she delivered a viable female infant on 10/18/2022.  Her postpartum course was uncomplicated and on PPD#2, she reported well controlled pain, spontaneous voiding, ambulating without difficulty, and tolerating PO.  She was stable for discharge home on 10/20/2022 with plans for in-office follow up.  She received Rhogam prior to discharge.   Hemoglobin  Date Value Ref Range Status  10/19/2022 10.5 (L) 12.0 - 15.0 g/dL Final   HCT  Date Value Ref Range Status  10/19/2022 30.5 (L) 36.0 - 46.0 % Final    Physical Exam:  General: alert and no distress Lochia: appropriate Uterine Fundus: firm Incision: healing well DVT Evaluation: No evidence of DVT seen on physical exam.  Discharge Diagnoses: Term Pregnancy-delivered  Discharge Information: Date: 10/20/2022 Activity: Pelvic rest, as tolerated Diet: routine Medications: Tylenol, motrin Condition: stable Instructions: Refer to practice specific booklet.  Discussed prior to discharge.  Discharge to: Home  Follow-up Information     [place] Follow up.   Why: Please follow up for a 6 week postpartum visit. Contact information: [address] [place] [address] [phone]                 Newborn Data: Live born female  Birth Weight: 7 lb 5.5 oz (3330 g) APGAR: 9, 9  Newborn Delivery   Birth date/time: 10/18/2022 19:55:29 Delivery type: Vaginal, Spontaneous     Home with mother.  Natasha Hensley 10/20/2022, 9:52 PM

## 2022-11-03 ENCOUNTER — Inpatient Hospital Stay (HOSPITAL_COMMUNITY): Payer: Self-pay

## 2022-11-03 ENCOUNTER — Inpatient Hospital Stay (HOSPITAL_COMMUNITY): Admission: RE | Admit: 2022-11-03 | Payer: Self-pay | Source: Home / Self Care | Admitting: Obstetrics and Gynecology

## 2022-11-12 ENCOUNTER — Telehealth (HOSPITAL_COMMUNITY): Payer: Self-pay | Admitting: *Deleted

## 2022-11-12 NOTE — Telephone Encounter (Signed)
Attempted hospital discharge follow-up call. Left message for patient to return RN call with any questions or concerns. Deforest Hoyles, RN, 11/12/22, [phone]

## 2023-02-17 ENCOUNTER — Telehealth (INDEPENDENT_AMBULATORY_CARE_PROVIDER_SITE_OTHER): Payer: Self-pay | Admitting: Otolaryngology

## 2023-02-17 NOTE — Telephone Encounter (Signed)
confirmed appt & location [phone]

## 2023-02-18 ENCOUNTER — Ambulatory Visit (INDEPENDENT_AMBULATORY_CARE_PROVIDER_SITE_OTHER): Admitting: Otolaryngology

## 2023-02-18 ENCOUNTER — Encounter (INDEPENDENT_AMBULATORY_CARE_PROVIDER_SITE_OTHER): Payer: Self-pay

## 2023-02-18 VITALS — BP 107/69 | HR 84 | Resp 19 | Wt 177.0 lb

## 2023-02-18 DIAGNOSIS — J358 Other chronic diseases of tonsils and adenoids: Secondary | ICD-10-CM | POA: Diagnosis not present

## 2023-02-18 DIAGNOSIS — J039 Acute tonsillitis, unspecified: Secondary | ICD-10-CM

## 2023-02-21 NOTE — Progress Notes (Signed)
Dear Dr. Damita Lack, Here is my assessment for our mutual patient, Natasha Hensley. Thank you for allowing me the opportunity to care for your patient. Please do not hesitate to contact me should you have any other questions. Sincerely, Dr. Jovita Kussmaul  Otolaryngology Clinic Note Referring provider: Dr. Damita Lack HPI:  Natasha Hensley is a 33 y.o. female kindly referred by Dr. Damita Lack for evaluation of tonsillitis.  Patient reports episodes of tonsillitis more frequently with young children; also reporting recurrent tonsil stones which are worse now, painful and difficult to manage. No frequent tonsil infections needing antibiotics but did have one this past winter. Has tried gargles without improvement. No prior PTA, no B symptoms. No issues with snoring or OSA concerns/apneas.  H&N Surgery: no Personal or FHx of bleeding dz or anesthesia difficulty: no  GLP-1: no AP/AC: no  PMHx: Scoliosis, Migraines  Tobacco: no. Lives in [place], [place].  Independent Review of Additional Tests or Records:  Natasha Hensley, [place] 02/12/2023 and 12/2022 reviewed and uploaded or available in chart in media tab: noted recurrent tonsil stones, had sinus infection 3 weeks ago and given abx; has right sore throat, difficulty swallowing;Rx: Cefdinir (prior got augmentin); Dx: Tonsillitis; Rx: Ref ENT Labs CBC 10/19/2022: WBC 12.8, Hgb 10.5, Plt 213 PMH/Meds/All/SocHx/FamHx/ROS:   Past Medical History:  Diagnosis Date   Diverticulitis    Migraines    Scoliosis    STD (sexually transmitted disease)    HSV     Past Surgical History:  Procedure Laterality Date   BREAST ENHANCEMENT SURGERY  08/2016   SPINAL FUSION  09/2015    Family History  Problem Relation Age of Onset   Lupus Mother    Skin cancer Father    Diabetes Father      Social Connections: Not on file      Current Outpatient Medications:    cefdinir (OMNICEF) 300 MG capsule, Take 300 mg by mouth 2 (two) times daily., Disp: , Rfl:     norethindrone-ethinyl estradiol (JUNEL 1/20) 1-20 MG-MCG tablet, Take 1 tablet by mouth daily., Disp: , Rfl:    acetaminophen (TYLENOL) 325 MG tablet, Take 2 tablets (650 mg total) by mouth every 4 (four) hours as needed (for pain scale < 4). (Patient not taking: Reported on 02/18/2023), Disp: 30 tablet, Rfl: 0   ibuprofen (ADVIL) 600 MG tablet, Take 1 tablet (600 mg total) by mouth every 6 (six) hours. (Patient not taking: Reported on 02/18/2023), Disp: 30 tablet, Rfl: 0   Physical Exam:   BP 107/69 (BP Location: Left Arm, Patient Position: Sitting, Cuff Size: Normal)   Pulse 84   Resp 19   Wt 177 lb (80.3 kg)   SpO2 97%   BMI 29.45 kg/m   Salient findings:  CN II-XII intact  Bilateral EAC clear and TM intact with well pneumatized middle ear spaces Anterior rhinoscopy: Septum intact; bilateral inferior turbinates without significant hypertrophy; no purulence No lesions of oral cavity/oropharynx; tonsils 2/2, normal in appearance today No obviously palpable neck masses/lymphadenopathy/thyromegaly No respiratory distress or stridor  Seprately Identifiable Procedures:  None  Impression & Plans:  Natasha Hensley is a 33 y.o. female with   1. Tonsillitis   2. Tonsillolith    Chronic issues with tonsils with not enough infections per [facility] criteria, though does have issues with painful tonsilloliths. We discussed management for this, including observation, antibiotics, and tonsillectomy (does not meet criteria strictly from tonsillitis perspective but given painful tonsilloliths, I think reasonable). We discussed R/B/A for Tonsillectomy including significant post-op  pain, bleeding (3%, including life threatening bleeding and requiring return to OR), and infections (still with pharyngitis) as well as persistent symptoms and risk of anesthesia. We also discussed post-op management and risks.  Patient would like to proceed with Tonsillectomy.  F/u 6 weeks post op Phone visit  See below  regarding exact medications prescribed this encounter including dosages and route: No orders of the defined types were placed in this encounter.     Thank you for allowing me the opportunity to care for your patient. Please do not hesitate to contact me should you have any other questions.  Sincerely, Jovita Kussmaul, MD Otolaryngologist (ENT), [facility] Phone: [phone] Fax: [phone]  02/21/2023, 1:47 PM   MDM:  Level 4 Complexity/Problems addressed: mod - chronic problem which is worsening Data complexity: mod - independent review of notes, labs - Morbidity: mod - decision for surgery  - Prescription Drug prescribed or managed: no

## 2023-05-25 ENCOUNTER — Other Ambulatory Visit: Payer: Self-pay

## 2023-05-25 ENCOUNTER — Emergency Department (HOSPITAL_COMMUNITY)
Admission: EM | Admit: 2023-05-25 | Discharge: 2023-05-25 | Disposition: A | Discharge status: Home / Self Care | Attending: Emergency Medicine | Admitting: Emergency Medicine

## 2023-05-25 ENCOUNTER — Encounter (HOSPITAL_COMMUNITY): Payer: Self-pay

## 2023-05-25 DIAGNOSIS — R1032 Left lower quadrant pain: Secondary | ICD-10-CM | POA: Insufficient documentation

## 2023-05-25 LAB — URINALYSIS, ROUTINE W REFLEX MICROSCOPIC
Bilirubin Urine: NEGATIVE
Glucose, UA: NEGATIVE mg/dL
Ketones, ur: NEGATIVE mg/dL
Nitrite: NEGATIVE
Protein, ur: NEGATIVE mg/dL
Specific Gravity, Urine: 1.024 (ref 1.005–1.030)
pH: 6 (ref 5.0–8.0)

## 2023-05-25 LAB — CBC WITH DIFFERENTIAL/PLATELET
Abs Immature Granulocytes: 0.02 10*3/uL (ref 0.00–0.07)
Basophils Absolute: 0.1 10*3/uL (ref 0.0–0.1)
Basophils Relative: 1 %
Eosinophils Absolute: 0.1 10*3/uL (ref 0.0–0.5)
Eosinophils Relative: 1 %
HCT: 43.7 % (ref 36.0–46.0)
Hemoglobin: 14.2 g/dL (ref 12.0–15.0)
Immature Granulocytes: 0 %
Lymphocytes Relative: 16 %
Lymphs Abs: 1.6 10*3/uL (ref 0.7–4.0)
MCH: 29.4 pg (ref 26.0–34.0)
MCHC: 32.5 g/dL (ref 30.0–36.0)
MCV: 90.5 fL (ref 80.0–100.0)
Monocytes Absolute: 0.4 10*3/uL (ref 0.1–1.0)
Monocytes Relative: 5 %
Neutro Abs: 7.4 10*3/uL (ref 1.7–7.7)
Neutrophils Relative %: 77 %
Platelets: 262 10*3/uL (ref 150–400)
RBC: 4.83 MIL/uL (ref 3.87–5.11)
RDW: 13.6 % (ref 11.5–15.5)
WBC: 9.5 10*3/uL (ref 4.0–10.5)
nRBC: 0 % (ref 0.0–0.2)

## 2023-05-25 LAB — LIPASE, BLOOD: Lipase: 28 U/L (ref 11–51)

## 2023-05-25 LAB — COMPREHENSIVE METABOLIC PANEL WITH GFR
ALT: 16 U/L (ref 0–44)
AST: 17 U/L (ref 15–41)
Albumin: 3.5 g/dL (ref 3.5–5.0)
Alkaline Phosphatase: 63 U/L (ref 38–126)
Anion gap: 6 (ref 5–15)
BUN: 12 mg/dL (ref 6–20)
CO2: 25 mmol/L (ref 22–32)
Calcium: 9 mg/dL (ref 8.9–10.3)
Chloride: 105 mmol/L (ref 98–111)
Creatinine, Ser: 0.71 mg/dL (ref 0.44–1.00)
GFR, Estimated: >60 mL/min (ref >60)
Glucose, Bld: 94 mg/dL (ref 70–99)
Potassium: 4.3 mmol/L (ref 3.5–5.1)
Sodium: 136 mmol/L (ref 135–145)
Total Bilirubin: 0.4 mg/dL (ref 0.0–1.2)
Total Protein: 7.7 g/dL (ref 6.5–8.1)

## 2023-05-25 LAB — HCG, SERUM, QUALITATIVE: Preg, Serum: NEGATIVE

## 2023-05-25 MED ORDER — DICYCLOMINE HCL 10 MG PO CAPS
10.0000 mg | ORAL_CAPSULE | Freq: Once | ORAL | Status: AC
  Administered 2023-05-25: 10 mg via ORAL
  Filled 2023-05-25: qty 1

## 2023-05-25 MED ORDER — NITROFURANTOIN MONOHYD MACRO 100 MG PO CAPS
100.0000 mg | ORAL_CAPSULE | Freq: Once | ORAL | Status: AC
  Administered 2023-05-25: 100 mg via ORAL
  Filled 2023-05-25: qty 1

## 2023-05-25 MED ORDER — DICYCLOMINE HCL 20 MG PO TABS: 20.0000 mg | ORAL_TABLET | Freq: Two times a day (BID) | ORAL | 0 refills | Status: AC

## 2023-05-25 MED ORDER — NITROFURANTOIN MONOHYD MACRO 100 MG PO CAPS: 100.0000 mg | ORAL_CAPSULE | Freq: Two times a day (BID) | ORAL | 0 refills | Status: AC

## 2023-05-25 MED ORDER — IBUPROFEN 200 MG PO TABS
600.0000 mg | ORAL_TABLET | Freq: Once | ORAL | Status: AC
  Administered 2023-05-25: 600 mg via ORAL
  Filled 2023-05-25: qty 3

## 2023-05-25 NOTE — ED Notes (Signed)
 Patient is resting comfortably.

## 2023-05-25 NOTE — Discharge Instructions (Addendum)
 Your urine showed you may have a urinary tract infection.  You are being treated with a antibiotic called Macrobid (Nitrofurantoin) Take this antibiotic 2 times a day for the next 5 days.  First dose here today.  Take the full course of your antibiotic even if you start feeling better. Antibiotics may cause you to have diarrhea.  Your urine has been sent off for culture, you will be contacted if your antibiotic needs to be changed  You may use up to 600mg  ibuprofen  every 6 hours as needed for pain.  Do not exceed 2.4g of ibuprofen  per day.  You have been prescribed a medication called Bentyl to use as needed for abdominal pain.  Please take as prescribed.  You were given your first dose here today  Your blood counts, electrolytes, kidney, liver, pancreas labs were normal.  Your pregnancy test was negative.   Please eat a liquid diet and then advance your diet as tolerated.  Please follow-up with the GI doctor listed below as soon as possible for further evaluation of your episodic abdominal pain.  Please return to the ER if you have worsening abdominal pain, vomiting, fevers, any other new or concerning symptoms.

## 2023-05-25 NOTE — ED Notes (Signed)
 Declined last set vs

## 2023-05-25 NOTE — ED Provider Notes (Signed)
 [facility] AT [facility] Provider Note   CSN: [id number] Arrival date & time: 05/25/23  1020     History  Chief Complaint  Patient presents with   Abdominal Pain    Natasha Hensley is a 33 y.o. female with history of diverticulitis, presents with concern for left lower quadrant pain that has been ongoing for the past 2 days.  States pain is fairly constant and feels like a sharp pain.  Denies any associated nausea, vomiting, fevers, or chills.  Denies any diarrhea or flank pain.  Reports normal bowel movements, no bloody stools.  Reports pain is mostly in location as she had her diverticulitis, but does not feel the same as when she had diverticulitis.  Denies any dysuria, hematuria, increased frequency, or abnormal vaginal discharge. Able to tolerate food and water without difficulty.   Abdominal Pain      Home Medications Prior to Admission medications   Medication Sig Start Date End Date Taking? Authorizing Provider  dicyclomine (BENTYL) 20 MG tablet Take 1 tablet (20 mg total) by mouth 2 (two) times daily. 05/25/23  Yes Rexie Catena, PA-C  nitrofurantoin, macrocrystal-monohydrate, (MACROBID) 100 MG capsule Take 1 capsule (100 mg total) by mouth 2 (two) times daily. 05/25/23  Yes Rexie Catena, PA-C  acetaminophen  (TYLENOL ) 325 MG tablet Take 2 tablets (650 mg total) by mouth every 4 (four) hours as needed (for pain scale < 4). Patient not taking: Reported on 02/18/2023 10/20/22   Gretchen Leavell, MD  cefdinir (OMNICEF) 300 MG capsule Take 300 mg by mouth 2 (two) times daily. 02/12/23   [provider]  ibuprofen  (ADVIL ) 600 MG tablet Take 1 tablet (600 mg total) by mouth every 6 (six) hours. Patient not taking: Reported on 02/18/2023 10/20/22   Gretchen Leavell, MD  norethindrone-ethinyl estradiol  (JUNEL 1/20) 1-20 MG-MCG tablet Take 1 tablet by mouth daily.    [provider]      Allergies    Hydrocodone    Review of  Systems   Review of Systems  Gastrointestinal:  Positive for abdominal pain.    Physical Exam Updated Vital Signs BP 105/70 (BP Location: Left Arm)   Pulse 79   Temp 98.2 F (36.8 C) (Oral)   Resp 16   LMP 05/11/2023 (Approximate)   SpO2 100%  Physical Exam Vitals and nursing note reviewed.  Constitutional:      General: She is not in acute distress.    Appearance: She is well-developed.  HENT:     Head: Normocephalic and atraumatic.  Eyes:     Conjunctiva/sclera: Conjunctivae normal.  Cardiovascular:     Rate and Rhythm: Normal rate and regular rhythm.     Heart sounds: No murmur heard. Pulmonary:     Effort: Pulmonary effort is normal. No respiratory distress.     Breath sounds: Normal breath sounds.  Abdominal:     Palpations: Abdomen is soft.     Tenderness: There is no abdominal tenderness.     Comments: Reports discomfort in the lower abdomen, but does not appear to have any tenderness to palpation of abdomen  Musculoskeletal:        General: No swelling.     Cervical back: Neck supple.  Skin:    General: Skin is warm and dry.     Capillary Refill: Capillary refill takes less than 2 seconds.  Neurological:     Mental Status: She is alert.  Psychiatric:  Mood and Affect: Mood normal.     ED Results / Procedures / Treatments   Labs (all labs ordered are listed, but only abnormal results are displayed) Labs Reviewed  URINALYSIS, ROUTINE W REFLEX MICROSCOPIC - Abnormal; Notable for the following components:      Result Value   APPearance HAZY (*)    Hgb urine dipstick SMALL (*)    Leukocytes,Ua LARGE (*)    Bacteria, UA RARE (*)    All other components within normal limits  URINE CULTURE  COMPREHENSIVE METABOLIC PANEL WITH GFR  LIPASE, BLOOD  CBC WITH DIFFERENTIAL/PLATELET  HCG, SERUM, QUALITATIVE    EKG None  Radiology No results found.  Procedures Procedures    Medications Ordered in ED Medications  nitrofurantoin  (macrocrystal-monohydrate) (MACROBID) capsule 100 mg (100 mg Oral Given 05/25/23 1300)  dicyclomine (BENTYL) capsule 10 mg (10 mg Oral Given 05/25/23 1300)  ibuprofen  (ADVIL ) tablet 600 mg (600 mg Oral Given 05/25/23 1300)    ED Course/ Medical Decision Making/ A&P                                 Medical Decision Making Amount and/or Complexity of Data Reviewed Labs: ordered.  Risk OTC drugs. Prescription drug management.     Differential diagnosis includes but is not limited to Cholelithiasis, cholangitis, choledocholithiasis, peptic ulcer, gastritis, gastroenteritis, appendicitis, IBS, IBD, DKA, nephrolithiasis, UTI, pyelonephritis, pancreatitis, diverticulitis, mesenteric ischemia, abdominal aortic aneurysm, small bowel obstruction, volvulus, ovarian torsion and pregnancy related concerns in females of childbearing age    ED Course:  Upon initial evaluation, patient is well-appearing, stable vitals.  Reporting lower abdominal pain, but abdomen non-tender to palpation.  No rebound or guarding.  She is tolerating p.o. intake without difficulty.  Had shared decision making conversation with the patient, will hold off on CT imaging at this time. I have low concern for acute intra-abdominal pathology given her abdomen is soft, with no rebound or guarding, pain ongoing for a couple days without worsening, tolerating PO intake, and her labs are unremarkable. No signs of SBO as she is still having normal bowel movements. Could be diverticulitis, but do not suspect any complication such as abscess or perforation as her exam and labs are unremarkable and she says this pain is not as bad as when she did have diverticulitis noted on CT in 2020. Will go ahead and treat for possible UTI given leukocytes and lower abdominal pain.  Labs Ordered: I Ordered, and personally interpreted labs.  The pertinent results include:   CBC, CMP, lipase within normal limits Pregnancy negative Urinalysis with large  amount of leukocytes and mucous   Medications Given: Ibuprofen , bentyl given for pain Nitrofurantoin given for pain   Impression: Lower abdominal pain  Disposition:  The patient was discharged home with instructions to take course of macrobid as prescribed. Bentyl and ibuprofen  as needed for pain. Liquid diet and then advance diet as tolerated. Follow up with GI as soon as possible, their contact information was provided Return precautions given.    Record Review: External records from outside source obtained and reviewed including CT scan from 2020 showing diverticulitis     This chart was dictated using voice recognition software, Dragon. Despite the best efforts of this provider to proofread and correct errors, errors may still occur which can change documentation meaning.          Final Clinical Impression(s) / ED Diagnoses Final diagnoses:  Left lower quadrant abdominal pain    Rx / DC Orders ED Discharge Orders          Ordered    nitrofurantoin, macrocrystal-monohydrate, (MACROBID) 100 MG capsule  2 times daily        05/25/23 1257    dicyclomine (BENTYL) 20 MG tablet  2 times daily        05/25/23 1257              Rexie Catena, PA-C 05/25/23 1314    Russella Courts A, DO 05/28/23 [phone]

## 2023-05-25 NOTE — ED Triage Notes (Signed)
 Pt arrived reporting left lower quadrant pain x3 days. Constant 7/10 pain. Denies N/V. States has history of diverticulitis. Does not have G.I doc. Patient states no other symptoms.

## 2023-05-26 LAB — URINE CULTURE
